# Patient Record
Sex: Male | Born: 1990 | Race: Black or African American | Hispanic: No | Smoking: Former smoker
Health system: Southern US, Community
[De-identification: ages and names within clinical notes are randomized; demographics above are authoritative.]

## PROBLEM LIST (undated history)

## (undated) DIAGNOSIS — I10 Essential (primary) hypertension: Secondary | ICD-10-CM

---
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
## 2010-05-20 ENCOUNTER — Emergency Department (HOSPITAL_COMMUNITY)
Admission: EM | Admit: 2010-05-20 | Discharge: 2010-05-21 | Payer: Self-pay | Source: Home / Self Care | Admitting: Emergency Medicine

## 2011-03-24 ENCOUNTER — Emergency Department (HOSPITAL_COMMUNITY): Admission: EM | Admit: 2011-03-24 | Payer: Self-pay | Source: Home / Self Care

## 2015-04-05 ENCOUNTER — Emergency Department (HOSPITAL_COMMUNITY)
Admission: EM | Admit: 2015-04-05 | Discharge: 2015-04-06 | Disposition: A | Discharge status: Home / Self Care | Payer: Worker's Compensation | Attending: Emergency Medicine | Admitting: Emergency Medicine

## 2015-04-05 ENCOUNTER — Encounter (HOSPITAL_COMMUNITY): Payer: Self-pay | Admitting: Emergency Medicine

## 2015-04-05 DIAGNOSIS — Z720 Tobacco use: Secondary | ICD-10-CM | POA: Insufficient documentation

## 2015-04-05 DIAGNOSIS — S79911A Unspecified injury of right hip, initial encounter: Secondary | ICD-10-CM | POA: Insufficient documentation

## 2015-04-05 DIAGNOSIS — Y92410 Unspecified street and highway as the place of occurrence of the external cause: Secondary | ICD-10-CM | POA: Diagnosis not present

## 2015-04-05 DIAGNOSIS — S79912A Unspecified injury of left hip, initial encounter: Secondary | ICD-10-CM | POA: Insufficient documentation

## 2015-04-05 DIAGNOSIS — S3992XA Unspecified injury of lower back, initial encounter: Secondary | ICD-10-CM | POA: Diagnosis present

## 2015-04-05 DIAGNOSIS — Y998 Other external cause status: Secondary | ICD-10-CM | POA: Diagnosis not present

## 2015-04-05 DIAGNOSIS — M25552 Pain in left hip: Secondary | ICD-10-CM

## 2015-04-05 DIAGNOSIS — Y9389 Activity, other specified: Secondary | ICD-10-CM | POA: Diagnosis not present

## 2015-04-05 DIAGNOSIS — S39012A Strain of muscle, fascia and tendon of lower back, initial encounter: Secondary | ICD-10-CM | POA: Diagnosis not present

## 2015-04-05 DIAGNOSIS — M25551 Pain in right hip: Secondary | ICD-10-CM
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
## 2015-04-06 ENCOUNTER — Emergency Department (HOSPITAL_COMMUNITY): Payer: Worker's Compensation
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
## 2015-04-06 MED ORDER — NAPROXEN 500 MG PO TABS: 500.0000 mg | ORAL_TABLET | Freq: Two times a day (BID) | ORAL | Status: DC

## 2015-04-06 MED ORDER — TRAMADOL HCL 50 MG PO TABS: 50.0000 mg | ORAL_TABLET | Freq: Four times a day (QID) | ORAL | Status: DC | PRN

## 2015-04-06 MED ORDER — IBUPROFEN 800 MG PO TABS
800.0000 mg | ORAL_TABLET | Freq: Once | ORAL | Status: AC
  Administered 2015-04-06: 800 mg via ORAL
  Filled 2015-04-06: qty 1
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
## 2017-10-09 ENCOUNTER — Encounter (HOSPITAL_COMMUNITY): Payer: Self-pay | Admitting: Emergency Medicine

## 2017-10-09 ENCOUNTER — Emergency Department (HOSPITAL_COMMUNITY)
Admission: EM | Admit: 2017-10-09 | Discharge: 2017-10-09 | Disposition: A | Discharge status: Home / Self Care | Payer: Self-pay | Attending: Emergency Medicine | Admitting: Emergency Medicine

## 2017-10-09 ENCOUNTER — Other Ambulatory Visit: Payer: Self-pay

## 2017-10-09 DIAGNOSIS — N4830 Priapism, unspecified: Secondary | ICD-10-CM | POA: Insufficient documentation

## 2017-10-09 DIAGNOSIS — F17210 Nicotine dependence, cigarettes, uncomplicated: Secondary | ICD-10-CM | POA: Insufficient documentation

## 2017-10-09 LAB — CBC
HEMATOCRIT: 39.6 % (ref 39.0–52.0)
HEMOGLOBIN: 14.3 g/dL (ref 13.0–17.0)
MCH: 32.2 pg (ref 26.0–34.0)
MCHC: 36.1 g/dL — AB (ref 30.0–36.0)
MCV: 89.2 fL (ref 78.0–100.0)
Platelets: 274 10*3/uL (ref 150–400)
RBC: 4.44 MIL/uL (ref 4.22–5.81)
RDW: 13.1 % (ref 11.5–15.5)
WBC: 10.5 10*3/uL (ref 4.0–10.5)

## 2017-10-09 LAB — BASIC METABOLIC PANEL
Anion gap: 8 (ref 5–15)
BUN: 8 mg/dL (ref 6–20)
CO2: 21 mmol/L — AB (ref 22–32)
Calcium: 8.3 mg/dL — ABNORMAL LOW (ref 8.9–10.3)
Chloride: 106 mmol/L (ref 101–111)
Creatinine, Ser: 0.76 mg/dL (ref 0.61–1.24)
GFR calc Af Amer: >60 mL/min (ref >60)
GFR calc non Af Amer: >60 mL/min (ref >60)
GLUCOSE: 98 mg/dL (ref 65–99)
POTASSIUM: 4 mmol/L (ref 3.5–5.1)
Sodium: 135 mmol/L (ref 135–145)

## 2017-10-09 MED ORDER — SODIUM CHLORIDE 0.9 % IV BOLUS
1000.0000 mL | Freq: Once | INTRAVENOUS | Status: AC
  Administered 2017-10-09: 1000 mL via INTRAVENOUS

## 2017-10-09 MED ORDER — HYDROMORPHONE HCL 2 MG/ML IJ SOLN: 0.5000 mg | Freq: Once | INTRAMUSCULAR | Status: DC

## 2017-10-09 MED ORDER — LORAZEPAM 2 MG/ML IJ SOLN
1.0000 mg | Freq: Once | INTRAMUSCULAR | Status: AC
  Administered 2017-10-09: 1 mg via INTRAVENOUS
  Filled 2017-10-09: qty 1

## 2017-10-09 MED ORDER — PHENYLEPHRINE 200 MCG/ML FOR PRIAPISM / HYPOTENSION
100.0000 ug | INTRAMUSCULAR | Status: DC | PRN
  Administered 2017-10-09: 100 ug via INTRACAVERNOUS
  Filled 2017-10-09: qty 50

## 2017-10-09 MED ORDER — HYDROMORPHONE HCL 1 MG/ML IJ SOLN: 0.5000 mg | Freq: Once | INTRAMUSCULAR | Status: DC

## 2017-10-09 MED ORDER — HYDROMORPHONE HCL 1 MG/ML IJ SOLN
1.0000 mg | Freq: Once | INTRAMUSCULAR | Status: AC
Start: 2017-10-09 — End: 2017-10-09
  Administered 2017-10-09: 1 mg via INTRAVENOUS
  Filled 2017-10-09: qty 1
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
## 2017-10-09 NOTE — ED Provider Notes (Signed)
[facility] Note   CSN: [id number] Arrival date & time: 10/09/17  [id number]     History   Chief Complaint Chief Complaint  Patient presents with  . Groin Swelling  . priapism    HPI Matthew Kaiser is a 27 y.o. male without significant past medical history, presenting to the ED complaining of priapism that began around 3:30 to 4 AM this morning.  Patient states his erection is painful and persistent since that time.  He states he attempted to masturbate, however without relief continues to have an erection.  He denies any new medications, daily medications, or illicit drug use.  States this is never happened before.  Denies any other associated symptoms.  No history of sickle cell anemia.  The history is provided by the patient.    History reviewed. No pertinent past medical history.  There are no active problems to display for this patient.   History reviewed. No pertinent surgical history.      Home Medications    Prior to Admission medications   Medication Sig Start Date End Date Taking? Authorizing Provider  naproxen (NAPROSYN) 500 MG tablet Take 1 tablet (500 mg total) by mouth 2 (two) times daily. 04/06/15   Dione BoozeGlick, David, MD  traMADol (ULTRAM) 50 MG tablet Take 1 tablet (50 mg total) by mouth every 6 (six) hours as needed. 04/06/15   Dione BoozeGlick, David, MD    Family History No family history on file.  Social History Social History   Tobacco Use  . Smoking status: Current Every Day Smoker    Packs/day: 0.50    Types: Cigarettes  . Smokeless tobacco: Current User  Substance Use Topics  . Alcohol use: Yes    Comment: socially  . Drug use: Yes    Types: Marijuana     Allergies   Patient has no known allergies.   Review of Systems Review of Systems  Genitourinary: Positive for penile pain.  All other systems reviewed and are negative.    Physical Exam Updated Vital Signs BP 122/71 (BP Location: Left Arm)    Pulse 60   Temp 98 F (36.7 C) (Oral)   Resp 18   Ht 6\' 3"  (1.905 m)   Wt 106.6 kg (235 lb)   SpO2 98%   BMI 29.37 kg/m   Physical Exam  Constitutional: He appears well-developed and well-nourished.  HENT:  Head: Normocephalic and atraumatic.  Eyes: Conjunctivae are normal.  Cardiovascular: Normal rate.  Pulmonary/Chest: Effort normal.  Abdominal: Soft.  Genitourinary: Testes normal. Circumcised.  Genitourinary Comments: Exam performed with chaperone present. Penis with firm erection. Glans appears pink and is well-perfused.   Neurological: He is alert.  Skin: Skin is warm.  Psychiatric: He has a normal mood and affect. His behavior is normal.  Nursing note and vitals reviewed.    ED Treatments / Results  Labs (all labs ordered are listed, but only abnormal results are displayed) Labs Reviewed  BASIC METABOLIC PANEL - Abnormal; Notable for the following components:      Result Value   CO2 21 (*)    Calcium 8.3 (*)    All other components within normal limits  CBC - Abnormal; Notable for the following components:   MCHC 36.1 (*)    All other components within normal limits    EKG None  Radiology No results found.  Procedures Irrigate corpus cavern, priapism Date/Time: 10/09/2017 1:34 PM Performed by: Verner Kopischke, [place] N, PA-C Authorized by: Zyara Riling, [place]  N, PA-C  Consent: Verbal consent obtained. Risks and benefits: risks, benefits and alternatives were discussed Consent given by: patient Patient understanding: patient states understanding of the procedure being performed Required items: required blood products, implants, devices, and special equipment available Patient identity confirmed: verbally with patient Time out: Immediately prior to procedure a "time out" was called to verify the correct patient, procedure, equipment, support staff and site/side marked as required. Preparation: Patient was prepped and draped in the usual sterile fashion. Local  anesthesia used: no  Anesthesia: Local anesthesia used: no  Sedation: Patient sedated: no  Patient tolerance: Patient tolerated the procedure well with no immediate complications Comments: Pt prepped and draped in a sterile fashion. With the assistance of Dr. Deretha Emory, procedure performed. Using a 21-gauge needle, 35 cc of blood was aspirated from the right corpus cavernosum.  200 mcg of phenylephrine was injected with significant improvement in erection following procedure.    (including critical care time)  Medications Ordered in ED Medications  phenylephrine 200 mcg / ml CONC. DILUTION INJ (ED / Urology USE ONLY) (100 mcg Intracavernosal Given by Other 10/09/17 1157)  HYDROmorphone (DILAUDID) injection 0.5 mg (0 mg Intravenous Hold 10/09/17 1036)  HYDROmorphone (DILAUDID) injection 1 mg (1 mg Intravenous Given 10/09/17 0953)  LORazepam (ATIVAN) injection 1 mg (1 mg Intravenous Given 10/09/17 0953)  sodium chloride 0.9 % bolus 1,000 mL (0 mLs Intravenous Stopped 10/09/17 1349)     Initial Impression / Assessment and Plan / ED Course  I have reviewed the triage vital signs and the nursing notes.  Pertinent labs & imaging results that were available during my care of the patient were reviewed by me and considered in my medical decision making (see chart for details).  Clinical Course as of Oct 09 1400  Sat Oct 09, 2017  1124 Pt re-evaluated with persistent priapism. Will proceed with aspiration and phenylephrine injection.  Does endorse marijuana use at this time.   [JR]  1230 35cc blood aspirated. phenylephrine injected with good response.   [JR]  1300 Pt re-evaluated. Pts pain is substantially relieved and corpus is bendable. Will consult urology for recommendations and follow up.   [JR]  1334 Pt discussed with Dr. Mena Goes with Urology. He states it is normal to have some swelling due to post-ischemic tissues changes and can take several days to resolve. Reports no further  interventions are required and pt is recommended to follow up with PCP.   [JR]    Clinical Course User Index [JR] Mazin Emma, [place] N, PA-C    Patient presenting to the ED with priapism that began around 3:30 to 4:00 AM this morning.   Endorses marijuana use, however denies any other illicit drugs or new medications.  This is patient's first occurrence.  On arrival, patient with persistent painful erection, though without evidence of acute ischemia.  Conservative therapy attempted initially, including pain medication and topical cold therapy.  Pain was somewhat improved, however did not resolve and priapism was persistent.  Proceeded with aspiration of corpus cavernosum and then injected 200 mcg of phenylephrine with significant improvement.  On reevaluation, corpus cavernosum was bendable and patient with significant improvement and relief of pain.  Patient monitored for 1 hour following procedure, with persistent relief.  Discussed patient with Dr. Mena Goes, with urology, who recommends patient is safe for discharge at this time with PCP follow-up.  No other recommendations at this time.  Discussed plan with patient answered all questions.  Safe for discharge.  Patient discussed with  and seen by Dr. Deretha Emory.  Discussed results, findings, treatment and follow up. Patient advised of return precautions. Patient verbalized understanding and agreed with plan.  Final Clinical Impressions(s) / ED Diagnoses   Final diagnoses:  Priapism    ED Discharge Orders    None       Windsor Zirkelbach, [place] N, PA-C 10/09/17 1402    Vanetta Mulders, MD 10/10/17 [phone]

## 2017-10-09 NOTE — ED Triage Notes (Signed)
Pt. Stated, ive had an erection for 4 hours and can not get it to go down.

## 2017-10-09 NOTE — Discharge Instructions (Signed)
Drink plenty of fluid. Avoid marijuana, as this can cause recurrence of symptoms. Avoid sexual stimulation or intercourse for the next few days to allow for proper healing.  Follow up with your primary care provider regarding your visit today. Return to the ER as needed.

## 2017-10-09 NOTE — ED Provider Notes (Signed)
Medical screening examination/treatment/procedure(s) were conducted as a shared visit with non-physician practitioner(s) and myself.  I personally evaluated the patient during the encounter.  None   Patient seen by me along with physician assistant.  Patient with an erection for over 4 hours.  No past history of similar problem.  Initially tried comfort measures with some pain medicine and antianxiety medicine some IV fluids and ice packs no change.  Went ahead and did a corpus drawl of blood throughout about 30 cc.  And then injected 200 micro grams of phenylephrine.  Significant relaxation of the penis.  Not completely flaccid discussed with urology.  And he said it was okay for discharge home.  Patient much more comfortable penis is bendable.  No follow-up by them necessary.   Vanetta MuldersZackowski, Jeselle Hiser, MD 10/09/17 1414

## 2018-03-10 ENCOUNTER — Encounter (HOSPITAL_COMMUNITY): Payer: Self-pay

## 2018-03-10 ENCOUNTER — Ambulatory Visit (HOSPITAL_COMMUNITY)
Admission: EM | Admit: 2018-03-10 | Discharge: 2018-03-10 | Disposition: A | Discharge status: Home / Self Care | Payer: Self-pay | Attending: Family Medicine | Admitting: Family Medicine

## 2018-03-10 DIAGNOSIS — R300 Dysuria: Secondary | ICD-10-CM | POA: Insufficient documentation

## 2018-03-10 DIAGNOSIS — F17210 Nicotine dependence, cigarettes, uncomplicated: Secondary | ICD-10-CM | POA: Insufficient documentation

## 2018-03-10 DIAGNOSIS — Z113 Encounter for screening for infections with a predominantly sexual mode of transmission: Secondary | ICD-10-CM | POA: Insufficient documentation

## 2018-03-10 NOTE — ED Provider Notes (Signed)
[facility]    CSN: [id number] Arrival date & time: 03/10/18  1613     History   Chief Complaint Chief Complaint  Patient presents with  . STD Testing    HPI Renee RamusJoshua A Tamburo is a 27 y.o. male.   Patient is a 27 year old male here for 1 week of dysuria.  Symptoms have been constant . He is requesting STD screening.  He has been having unprotected sex.  He he denies any associated abdominal pain, back pain, hematuria, fevers, chills, body aches.  ROS per HPI      History reviewed. No pertinent past medical history.  There are no active problems to display for this patient.   History reviewed. No pertinent surgical history.     Home Medications    Prior to Admission medications   Medication Sig Start Date End Date Taking? Authorizing Provider  naproxen (NAPROSYN) 500 MG tablet Take 1 tablet (500 mg total) by mouth 2 (two) times daily. 04/06/15   Dione BoozeGlick, David, MD  traMADol (ULTRAM) 50 MG tablet Take 1 tablet (50 mg total) by mouth every 6 (six) hours as needed. 04/06/15   Dione BoozeGlick, David, MD    Family History History reviewed. No pertinent family history.  Social History Social History   Tobacco Use  . Smoking status: Current Every Day Smoker    Packs/day: 0.50    Types: Cigarettes  . Smokeless tobacco: Current User  Substance Use Topics  . Alcohol use: Yes    Comment: socially  . Drug use: Yes    Types: Marijuana     Allergies   Patient has no known allergies.   Review of Systems Review of Systems   Physical Exam Triage Vital Signs ED Triage Vitals [03/10/18 1711]  Enc Vitals Group     BP (!) 145/81     Pulse Rate 65     Resp 20     Temp 98.3 F (36.8 C)     Temp Source Oral     SpO2 100 %     Weight      Height      Head Circumference      Peak Flow      Pain Score 0     Pain Loc      Pain Edu?      Excl. in GC?    No data found.  Updated Vital Signs BP (!) 145/81 (BP Location: Left Arm)   Pulse 65   Temp 98.3 F  (36.8 C) (Oral)   Resp 20   SpO2 100%   Visual Acuity Right Eye Distance:   Left Eye Distance:   Bilateral Distance:    Right Eye Near:   Left Eye Near:    Bilateral Near:     Physical Exam  Constitutional: He is oriented to person, place, and time. He appears well-developed and well-nourished.  Very pleasant. Non toxic or ill appearing.     HENT:  Head: Normocephalic and atraumatic.  Nose: Nose normal.  Eyes: Conjunctivae are normal.  Neck: Normal range of motion.  Pulmonary/Chest: Effort normal.  Abdominal: Soft.  Abdomen soft, non tender. No CVA tenderness. No rebound tenderness.     Musculoskeletal: Normal range of motion.  Neurological: He is alert and oriented to person, place, and time.  Skin: Skin is warm and dry.  Psychiatric: He has a normal mood and affect.  Nursing note and vitals reviewed.    UC Treatments / Results  Labs (all labs ordered are  listed, but only abnormal results are displayed) Labs Reviewed  URINE CYTOLOGY ANCILLARY ONLY    EKG None  Radiology No results found.  Procedures Procedures (including critical care time)  Medications Ordered in UC Medications - No data to display  Initial Impression / Assessment and Plan / UC Course  I have reviewed the triage vital signs and the nursing notes.  Pertinent labs & imaging results that were available during my care of the patient were reviewed by me and considered in my medical decision making (see chart for details).     Urine sent for STD screening.  Pt refusing treatment in clinic today wanting to wait on the results.  Labs pending and will call with any positive results.  Pt understanding.  Final Clinical Impressions(s) / UC Diagnoses   Final diagnoses:  Dysuria  Screening for STD (sexually transmitted disease)     Discharge Instructions     It was nice meeting you!!  We are screening you for STDs We will call with any positive results.    ED Prescriptions     None     Controlled Substance Prescriptions [place] Controlled Substance Registry consulted? Not Applicable   Janace Aris, NP 03/10/18 1734

## 2018-03-10 NOTE — Discharge Instructions (Addendum)
It was nice meeting you!!  We are screening you for STDs We will call with any positive results.

## 2018-03-10 NOTE — ED Triage Notes (Signed)
Pt presents to get STD Testing

## 2018-03-11 LAB — URINE CYTOLOGY ANCILLARY ONLY
CHLAMYDIA, DNA PROBE: NEGATIVE
NEISSERIA GONORRHEA: NEGATIVE
TRICH (WINDOWPATH): NEGATIVE

## 2018-03-15 LAB — URINE CYTOLOGY ANCILLARY ONLY: Candida vaginitis: NEGATIVE

## 2018-09-07 ENCOUNTER — Telehealth (INDEPENDENT_AMBULATORY_CARE_PROVIDER_SITE_OTHER): Payer: Self-pay

## 2018-09-07 NOTE — Telephone Encounter (Signed)
Called patient no answer. LMOM. We need to let him know per Mardella Layman she did not approve for him to RTW end of April.   Last form Mardella Layman filled out was for him to RTW  With no restrictions 08/31/2018.- form ready for pick up at the front desk.

## 2018-11-28 ENCOUNTER — Other Ambulatory Visit: Payer: Self-pay

## 2018-11-28 ENCOUNTER — Encounter (HOSPITAL_COMMUNITY): Payer: Self-pay | Admitting: Emergency Medicine

## 2018-11-28 ENCOUNTER — Emergency Department (HOSPITAL_COMMUNITY)
Admission: EM | Admit: 2018-11-28 | Discharge: 2018-11-28 | Disposition: A | Discharge status: Home / Self Care | Payer: Self-pay | Attending: Emergency Medicine | Admitting: Emergency Medicine

## 2018-11-28 ENCOUNTER — Emergency Department (HOSPITAL_COMMUNITY): Payer: Self-pay

## 2018-11-28 DIAGNOSIS — Y990 Civilian activity done for income or pay: Secondary | ICD-10-CM | POA: Insufficient documentation

## 2018-11-28 DIAGNOSIS — S61214A Laceration without foreign body of right ring finger without damage to nail, initial encounter: Secondary | ICD-10-CM | POA: Insufficient documentation

## 2018-11-28 DIAGNOSIS — Z23 Encounter for immunization: Secondary | ICD-10-CM | POA: Insufficient documentation

## 2018-11-28 DIAGNOSIS — Y9389 Activity, other specified: Secondary | ICD-10-CM | POA: Insufficient documentation

## 2018-11-28 DIAGNOSIS — S61214S Laceration without foreign body of right ring finger without damage to nail, sequela: Secondary | ICD-10-CM

## 2018-11-28 DIAGNOSIS — Y929 Unspecified place or not applicable: Secondary | ICD-10-CM | POA: Insufficient documentation

## 2018-11-28 DIAGNOSIS — F17210 Nicotine dependence, cigarettes, uncomplicated: Secondary | ICD-10-CM | POA: Insufficient documentation

## 2018-11-28 DIAGNOSIS — W25XXXA Contact with sharp glass, initial encounter: Secondary | ICD-10-CM | POA: Insufficient documentation

## 2018-11-28 IMAGING — DX RIGHT HAND - COMPLETE 3+ VIEW
3 series · 3 of 3 positions shown · non-contrast
Comparison: None.

CLINICAL DATA: 28-year-old male with cardia of the right ring
finger and increasing swelling.

EXAM:
RIGHT HAND - COMPLETE 3+ VIEW

[hand ap]
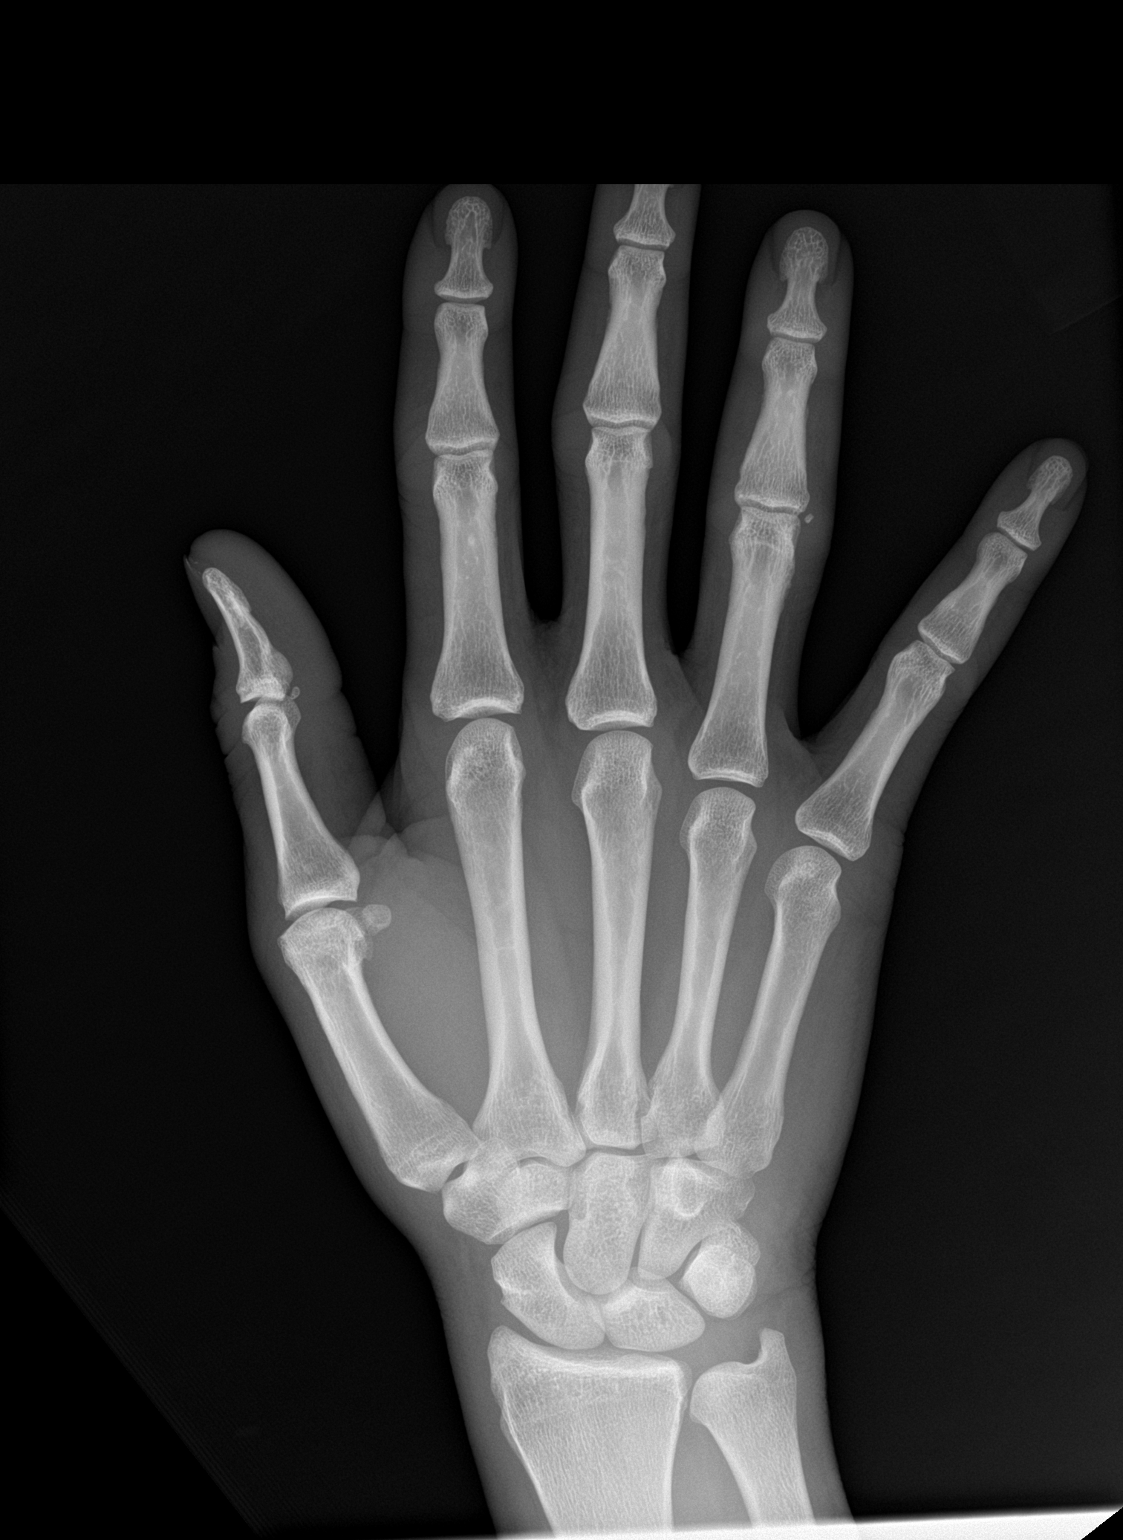

[hand obl]
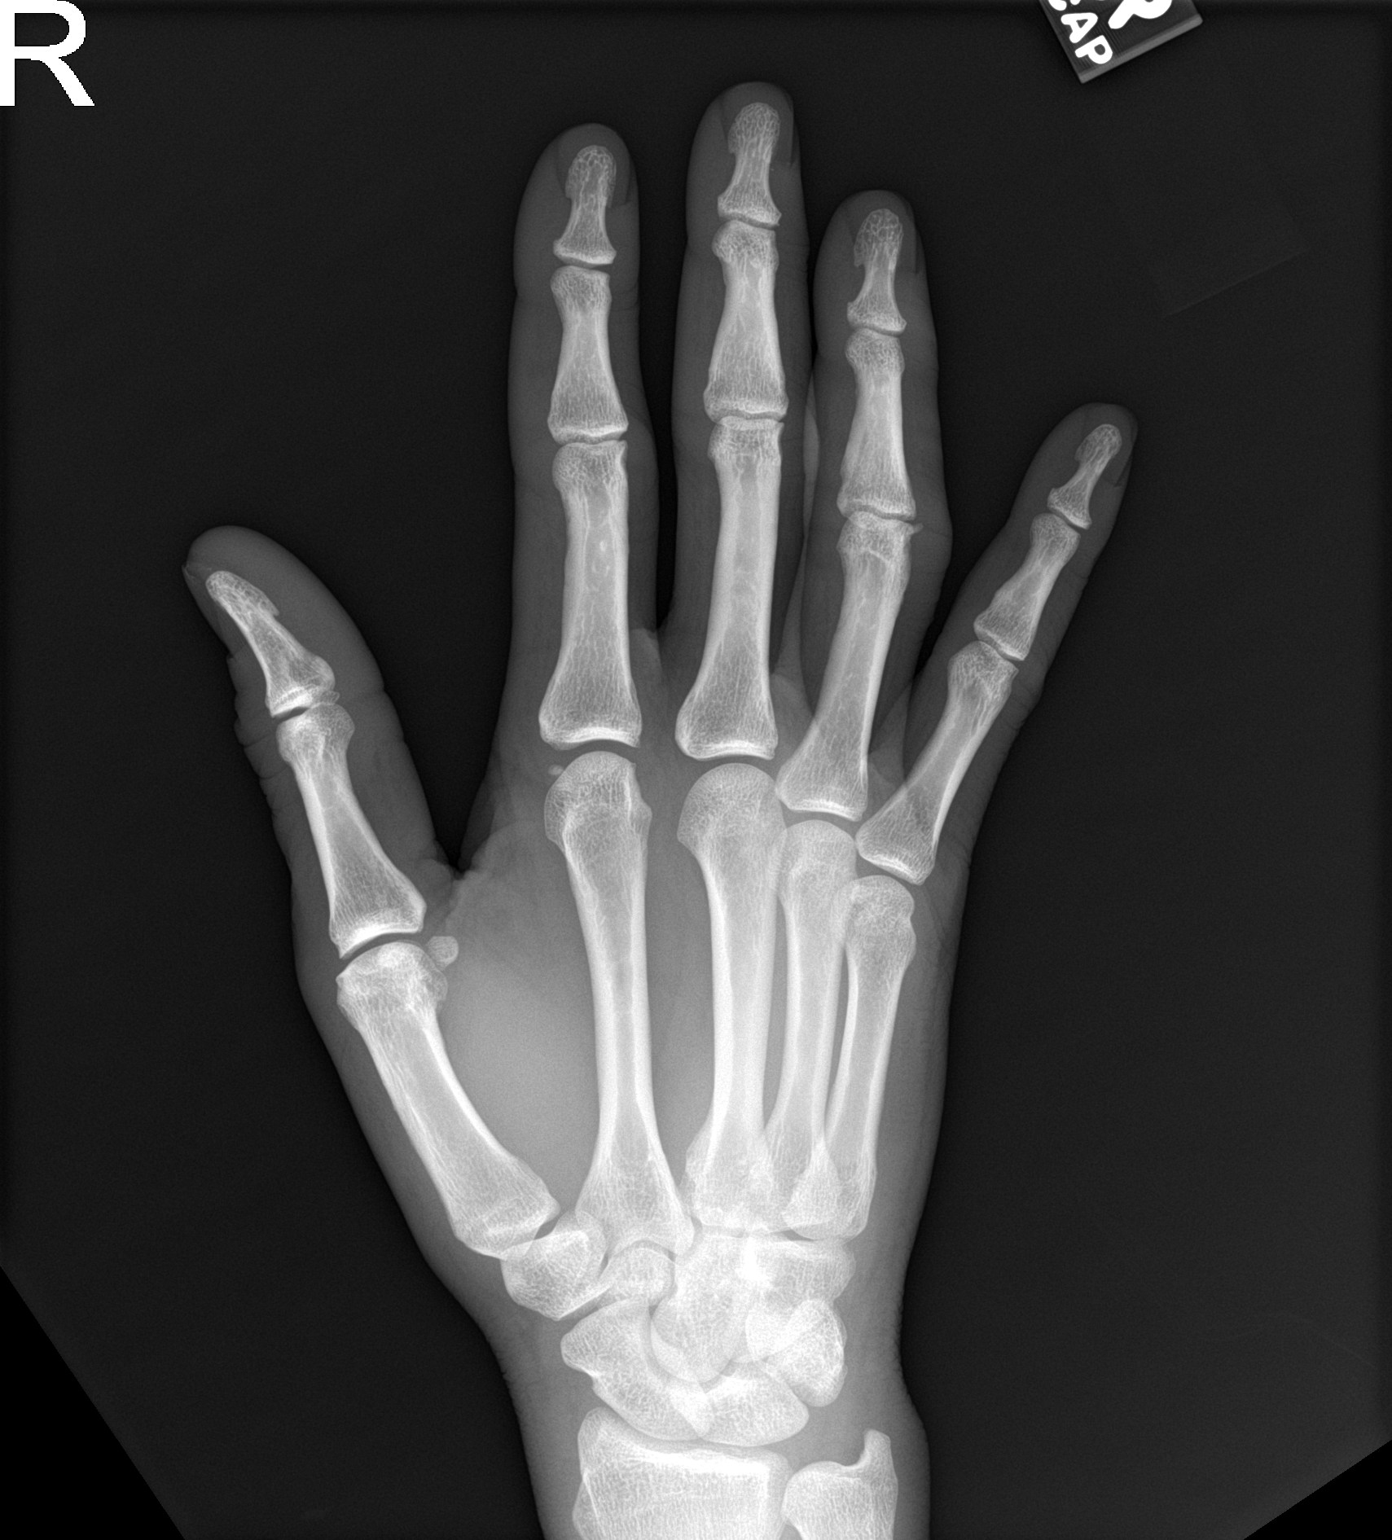

[hand lat]
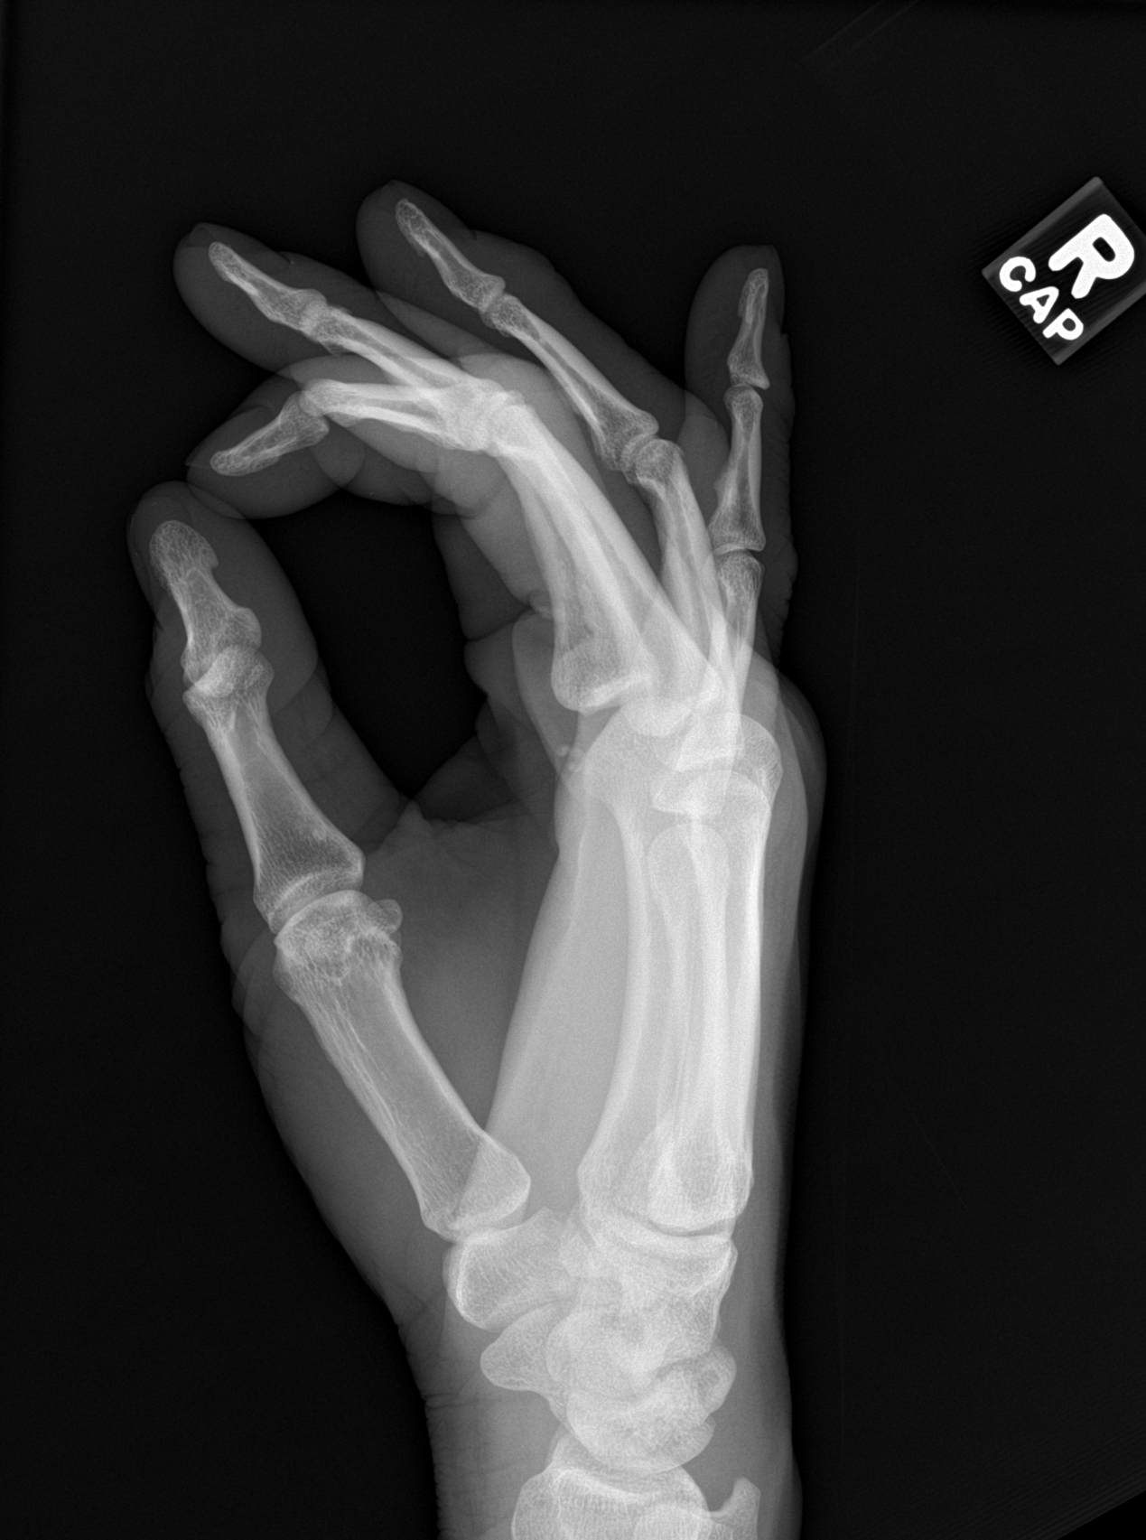

[3 of 3 positions shown; findings below may reference images not displayed]

FINDINGS: There is no acute fracture or dislocation. Small well corticated
calcific focus adjacent to the PIP of the fourth digit appears
chronic. The bones are well mineralized. No arthritic changes. The
soft tissues appear unremarkable. No radiopaque foreign object or
soft tissue gas.
IMPRESSION: Negative.

## 2018-11-28 MED ORDER — TETANUS-DIPHTH-ACELL PERTUSSIS 5-2.5-18.5 LF-MCG/0.5 IM SUSP
0.5000 mL | Freq: Once | INTRAMUSCULAR | Status: AC
  Administered 2018-11-28: 0.5 mL via INTRAMUSCULAR
  Filled 2018-11-28: qty 0.5

## 2018-11-28 MED ORDER — BACITRACIN ZINC 500 UNIT/GM EX OINT
TOPICAL_OINTMENT | Freq: Two times a day (BID) | CUTANEOUS | Status: DC
  Administered 2018-11-28: 1 via TOPICAL
  Filled 2018-11-28: qty 0.9

## 2018-11-28 MED ORDER — MUPIROCIN CALCIUM 2 % EX CREA: 1.0000 "application " | TOPICAL_CREAM | Freq: Two times a day (BID) | CUTANEOUS | 0 refills | Status: DC

## 2018-11-28 MED ORDER — CEPHALEXIN 500 MG PO CAPS: 500.0000 mg | ORAL_CAPSULE | Freq: Four times a day (QID) | ORAL | 0 refills | Status: DC

## 2018-11-28 MED ORDER — IBUPROFEN 400 MG PO TABS: 400.0000 mg | ORAL_TABLET | Freq: Four times a day (QID) | ORAL | 0 refills | Status: DC | PRN

## 2018-11-28 MED ORDER — CEPHALEXIN 500 MG PO CAPS
500.0000 mg | ORAL_CAPSULE | Freq: Once | ORAL | Status: AC
  Administered 2018-11-28: 500 mg via ORAL
  Filled 2018-11-28: qty 1

## 2018-11-28 MED ORDER — SULFAMETHOXAZOLE-TRIMETHOPRIM 800-160 MG PO TABS
1.0000 | ORAL_TABLET | Freq: Once | ORAL | Status: AC
  Administered 2018-11-28: 1 via ORAL
  Filled 2018-11-28: qty 1

## 2018-11-28 MED ORDER — SULFAMETHOXAZOLE-TRIMETHOPRIM 800-160 MG PO TABS: 1.0000 | ORAL_TABLET | Freq: Two times a day (BID) | ORAL | 0 refills | Status: AC

## 2018-11-28 MED ORDER — ACETAMINOPHEN 500 MG PO TABS
1000.0000 mg | ORAL_TABLET | Freq: Once | ORAL | Status: DC
  Filled 2018-11-28: qty 2

## 2018-11-28 NOTE — ED Provider Notes (Signed)
[facility]-EMERGENCY DEPT Provider Note   CSN: [id number] Arrival date & time: 11/28/18  0021    History   Chief Complaint Chief Complaint  Patient presents with  . Laceration  . Finger Injury    HPI Matthew Kaiser is a 28 y.o. male.     The history is provided by the patient.  Laceration  Location:  Finger Finger laceration location:  R ring finger Length:  .9  Depth:  Through dermis Quality: straight   Bleeding: uncontrolled   Time since incident:  9 days Laceration mechanism:  Broken glass (while working on car) Pain details:    Quality:  Aching   Severity:  Moderate   Timing:  Constant   Progression:  Unchanged Relieved by:  Nothing Worsened by:  Nothing Ineffective treatments:  None tried Tetanus status:  Out of date Associated symptoms: no fever, no focal weakness, no numbness, no redness and no streaking     History reviewed. No pertinent past medical history.  There are no active problems to display for this patient.   History reviewed. No pertinent surgical history.      Home Medications    Prior to Admission medications   Medication Sig Start Date End Date Taking? Authorizing Provider  naproxen (NAPROSYN) 500 MG tablet Take 1 tablet (500 mg total) by mouth 2 (two) times daily. 04/06/15   Dione BoozeGlick, David, MD  traMADol (ULTRAM) 50 MG tablet Take 1 tablet (50 mg total) by mouth every 6 (six) hours as needed. 04/06/15   Dione BoozeGlick, David, MD    Family History History reviewed. No pertinent family history.  Social History Social History   Tobacco Use  . Smoking status: Current Every Day Smoker    Packs/day: 0.50    Types: Cigarettes  . Smokeless tobacco: Current User  Substance Use Topics  . Alcohol use: Yes    Comment: socially  . Drug use: Yes    Types: Marijuana     Allergies   Patient has no known allergies.   Review of Systems Review of Systems  Constitutional: Negative for fever.  Respiratory: Negative for  cough and shortness of breath.   Cardiovascular: Negative for chest pain.  Musculoskeletal: Positive for arthralgias.  Skin: Positive for wound.  Neurological: Negative for focal weakness.  All other systems reviewed and are negative.    Physical Exam Updated Vital Signs BP (!) 150/93 (BP Location: Left Arm)   Pulse 79   Temp 98.3 F (36.8 C) (Oral)   Resp 16   Ht 6\' 3"  (1.905 m)   Wt 104.3 kg   SpO2 98%   BMI 28.75 kg/m   Physical Exam Vitals signs and nursing note reviewed.  Constitutional:      General: He is not in acute distress.    Appearance: He is normal weight.  HENT:     Head: Normocephalic and atraumatic.     Nose: Nose normal.  Eyes:     Conjunctiva/sclera: Conjunctivae normal.     Pupils: Pupils are equal, round, and reactive to light.  Neck:     Musculoskeletal: Normal range of motion and neck supple.  Cardiovascular:     Rate and Rhythm: Normal rate and regular rhythm.     Pulses: Normal pulses.     Heart sounds: Normal heart sounds.  Pulmonary:     Effort: Pulmonary effort is normal.     Breath sounds: Normal breath sounds.  Abdominal:     General: Abdomen is flat. Bowel sounds  are normal.     Tenderness: There is no abdominal tenderness.  Musculoskeletal: Normal range of motion.     Right wrist: Normal.     Right forearm: Normal.     Right hand: He exhibits laceration. He exhibits normal range of motion, no bony tenderness, normal two-point discrimination and normal capillary refill. Normal sensation noted. Normal strength noted.       Hands:     Comments: No Kanavel sign  Skin:    General: Skin is warm and dry.     Capillary Refill: Capillary refill takes less than 2 seconds.  Neurological:     General: No focal deficit present.     Mental Status: He is alert and oriented to person, place, and time.  Psychiatric:        Mood and Affect: Mood normal.        Behavior: Behavior normal.          ED Treatments / Results  Labs (all  labs ordered are listed, but only abnormal results are displayed) Labs Reviewed - No data to display  EKG None  Radiology No results found.  Procedures Procedures (including critical care time)  Medications Ordered in ED Medications  acetaminophen (TYLENOL) tablet 1,000 mg (1,000 mg Oral Refused 11/28/18 0215)  bacitracin ointment (1 application Topical Given 11/28/18 0215)  Tdap (BOOSTRIX) injection 0.5 mL (0.5 mLs Intramuscular Given 11/28/18 0114)  cephALEXin (KEFLEX) capsule 500 mg (500 mg Oral Given 11/28/18 0112)  sulfamethoxazole-trimethoprim (BACTRIM DS) 800-160 MG per tablet 1 tablet (1 tablet Oral Given 11/28/18 0112)     Initial Impression / Assessment and Plan / ED Course  Cleansed and soaked in betadine and sterile saline.  Tetanus updated will start PO and topical antibiotics RX sent to pharmacy and refer to hand surgery.  The wound is dirty and 27 days old and cannot be closed in the ED.  Buddy splint for comfort and wound care instructions.    Final Clinical Impressions(s) / ED Diagnoses  Return for intractable cough, coughing up blood,fevers >100.4 unrelieved by medication, shortness of breath, intractable vomiting, chest pain, shortness of breath, weakness,numbness, changes in speech, facial asymmetry,abdominal pain, passing out,Inability to tolerate liquids or food, cough, altered mental status or any concerns. No signs of systemic illness or infection. The patient is nontoxic-appearing on exam and vital signs are within normal limits.   I have reviewed the triage vital signs and the nursing notes. Pertinent labs &imaging results that were available during my care of the patient were reviewed by me and considered in my medical decision making (see chart for details).  After history, exam, and medical workup I feel the patient has been appropriately medically screened and is safe for discharge home. Pertinent diagnoses were discussed with the patient. Patient was  given return precautions    Demico Ploch, MD 11/28/18 9379

## 2018-11-28 NOTE — ED Triage Notes (Signed)
Pt reports cutting right ring finger last week and jammed finger. Pt now reporting increasing swelling and pain.

## 2019-03-06 ENCOUNTER — Other Ambulatory Visit: Payer: Self-pay

## 2019-03-06 ENCOUNTER — Encounter (HOSPITAL_COMMUNITY): Payer: Self-pay | Admitting: Emergency Medicine

## 2019-03-06 ENCOUNTER — Emergency Department (HOSPITAL_COMMUNITY)
Admission: EM | Admit: 2019-03-06 | Discharge: 2019-03-06 | Disposition: A | Discharge status: Home / Self Care | Payer: Self-pay | Attending: Emergency Medicine | Admitting: Emergency Medicine

## 2019-03-06 DIAGNOSIS — M545 Low back pain, unspecified: Secondary | ICD-10-CM

## 2019-03-06 DIAGNOSIS — H6122 Impacted cerumen, left ear: Secondary | ICD-10-CM | POA: Insufficient documentation

## 2019-03-06 DIAGNOSIS — F17210 Nicotine dependence, cigarettes, uncomplicated: Secondary | ICD-10-CM | POA: Insufficient documentation

## 2019-03-06 DIAGNOSIS — H60501 Unspecified acute noninfective otitis externa, right ear: Secondary | ICD-10-CM | POA: Insufficient documentation

## 2019-03-06 DIAGNOSIS — F17228 Nicotine dependence, chewing tobacco, with other nicotine-induced disorders: Secondary | ICD-10-CM | POA: Insufficient documentation

## 2019-03-06 DIAGNOSIS — R030 Elevated blood-pressure reading, without diagnosis of hypertension: Secondary | ICD-10-CM | POA: Insufficient documentation

## 2019-03-06 DIAGNOSIS — F1210 Cannabis abuse, uncomplicated: Secondary | ICD-10-CM | POA: Insufficient documentation

## 2019-03-06 MED ORDER — HYDROCODONE-ACETAMINOPHEN 5-325 MG PO TABS: 1.0000 | ORAL_TABLET | ORAL | 0 refills | Status: DC | PRN

## 2019-03-06 MED ORDER — CIPROFLOXACIN-DEXAMETHASONE 0.3-0.1 % OT SUSP
4.0000 [drp] | Freq: Once | OTIC | Status: AC
  Administered 2019-03-06: 4 [drp] via OTIC
  Filled 2019-03-06: qty 7.5

## 2019-03-06 NOTE — ED Provider Notes (Signed)
[facility] DEPT Provider Note   CSN: [id number] Arrival date & time: 03/06/19  1100     History   Chief Complaint Chief Complaint  Patient presents with  . Otalgia    HPI Matthew Kaiser is a 28 y.o. male.     The history is provided by the patient.  Otalgia Location:  Right Behind ear:  Swelling Quality:  Aching and throbbing Severity:  Severe Onset quality:  Gradual Duration:  2 weeks Timing:  Constant Progression:  Worsening Chronicity:  New Context: not direct blow, not elevation change, not foreign body in ear, not loud noise, not recent URI and not water in ear   Relieved by:  Nothing Worsened by:  Nothing Ineffective treatments:  None tried Associated symptoms: ear discharge   Associated symptoms: no abdominal pain, no congestion, no cough, no diarrhea, no fever, no headaches, no hearing loss, no neck pain, no rash, no rhinorrhea, no sore throat, no tinnitus and no vomiting   Risk factors: no recent travel, no chronic ear infection and no prior ear surgery   Back Pain Location:  Lumbar spine Quality:  Aching Radiates to:  Does not radiate Pain severity now: no current pain, severe at times. Pain is:  Unable to specify Onset quality:  Sudden Duration:  1 hour Timing:  Sporadic Progression:  Waxing and waning Chronicity:  Recurrent Context: lifting heavy objects   Context: not emotional stress, not falling, not jumping from heights, not MCA, not MVA, not occupational injury, not pedestrian accident, not physical stress, not recent illness, not recent injury and not twisting   Relieved by:  Being still, heating pad and NSAIDs Worsened by:  Bending Ineffective treatments:  Ibuprofen, lying down and being still Associated symptoms: no abdominal pain, no abdominal swelling, no bladder incontinence, no bowel incontinence, no chest pain, no dysuria, no fever, no headaches, no leg pain, no numbness, no paresthesias, no pelvic pain, no  perianal numbness, no tingling, no weakness and no weight loss   Risk factors: no hx of cancer, no hx of osteoporosis, no lack of exercise, no menopause, not obese, not pregnant, no recent surgery, no steroid use and no vascular disease     History reviewed. No pertinent past medical history.  There are no active problems to display for this patient.   History reviewed. No pertinent surgical history.      Home Medications    Prior to Admission medications   Medication Sig Start Date End Date Taking? Authorizing Provider  cephALEXin (KEFLEX) 500 MG capsule Take 1 capsule (500 mg total) by mouth 4 (four) times daily. 11/28/18   Palumbo, April, MD  ibuprofen (ADVIL) 400 MG tablet Take 1 tablet (400 mg total) by mouth every 6 (six) hours as needed. 11/28/18   Palumbo, April, MD  mupirocin cream (BACTROBAN) 2 % Apply 1 application topically 2 (two) times daily. 11/28/18   Palumbo, April, MD  naproxen (NAPROSYN) 500 MG tablet Take 1 tablet (500 mg total) by mouth 2 (two) times daily. 78/4/69   Delora Fuel, MD  traMADol (ULTRAM) 50 MG tablet Take 1 tablet (50 mg total) by mouth every 6 (six) hours as needed. 62/9/52   Delora Fuel, MD    Family History No family history on file.  Social History Social History   Tobacco Use  . Smoking status: Current Every Day Smoker    Packs/day: 0.50    Types: Cigarettes  . Smokeless tobacco: Current User  Substance Use Topics  .  Alcohol use: Yes    Comment: socially  . Drug use: Yes    Types: Marijuana     Allergies   Patient has no known allergies.   Review of Systems Review of Systems  Constitutional: Negative for fever and weight loss.  HENT: Positive for ear discharge and ear pain. Negative for congestion, hearing loss, rhinorrhea, sore throat and tinnitus.   Respiratory: Negative for cough.   Cardiovascular: Negative for chest pain.  Gastrointestinal: Negative for abdominal pain, bowel incontinence, diarrhea and vomiting.   Genitourinary: Negative for bladder incontinence, dysuria and pelvic pain.  Musculoskeletal: Positive for back pain. Negative for neck pain.  Skin: Negative for rash.  Neurological: Negative for tingling, weakness, numbness, headaches and paresthesias.  All other systems reviewed and are negative.    Physical Exam Updated Vital Signs BP (!) 152/111 (BP Location: Right Arm) Comment: error  Pulse 65   Temp 98.9 F (37.2 C) (Oral)   Resp 16   Ht 6\' 3"  (1.905 m)   Wt 106.6 kg   SpO2 100%   BMI 29.37 kg/m   Physical Exam Vitals signs and nursing note reviewed.  Constitutional:      General: He is not in acute distress.    Appearance: He is well-developed. He is not diaphoretic.  HENT:     Head: Normocephalic and atraumatic.     Right Ear: Decreased hearing noted. Drainage, swelling and tenderness present. No mastoid tenderness. Tympanic membrane is not injected.     Left Ear: There is impacted cerumen.     Ears:     Comments: Left ear with cerumen impaction R ear painful with movement of pinna, localized lymphadenopathy, Serous drainage and  Canal swelling. Debris noted within the canal. Eyes:     General: No scleral icterus.    Conjunctiva/sclera: Conjunctivae normal.  Neck:     Musculoskeletal: Normal range of motion and neck supple.  Cardiovascular:     Rate and Rhythm: Normal rate and regular rhythm.     Heart sounds: Normal heart sounds.  Pulmonary:     Effort: Pulmonary effort is normal. No respiratory distress.     Breath sounds: Normal breath sounds.  Abdominal:     Palpations: Abdomen is soft.     Tenderness: There is no abdominal tenderness.  Musculoskeletal:     Lumbar back: He exhibits normal range of motion and no bony tenderness.  Skin:    General: Skin is warm and dry.  Neurological:     Mental Status: He is alert.  Psychiatric:        Behavior: Behavior normal.      ED Treatments / Results  Labs (all labs ordered are listed, but only abnormal  results are displayed) Labs Reviewed - No data to display  EKG None  Radiology No results found.  Procedures Procedures (including critical care time)  Medications Ordered in ED Medications  ciprofloxacin-dexamethasone (CIPRODEX) 0.3-0.1 % OTIC (EAR) suspension 4 drop (has no administration in time range)     Initial Impression / Assessment and Plan / ED Course  I have reviewed the triage vital signs and the nursing notes.  Pertinent labs & imaging results that were available during my care of the patient were reviewed by me and considered in my medical decision making (see chart for details).        28 year old old male here primarily with pain in his right ear.  Patient states that has been worsening over the past 2 weeks.  He is  noted drainage from the right ear and now to have severe throbbing pain.  He also has some decreased hearing on that side.  Physical exam consistent with acute otitis externa. I placed ear wick in the ear.  He was given 4 drops of Ciprodex and instructed in home usage.  Patient also has a cerumen impaction on the left.  I have advised him to use Debrox at home.  Patient will receive pain medication he may use anti-inflammatories as needed.  Patient noted to be hypertensive here today.  He is advised to have his blood pressure repeated when he has not any stressful/painful situation and should he continue to have elevated pressures follow-up with a primary care doctor. Patient has had about 3 years of intermittent episodes of severe back pain.  Patient states that this happens frequently when he leans over to pick up his daughter.  He states that sometimes his back locks up and he has to lay on the floor for 15 to 20 minutes before he can move.  This seems to have increased in frequency.  Patient denies any red flag symptoms such as leg weakness, numbness, paresthesia of the perianal region.  He does not use IV drugs and has no recent procedures to his back.  I  have advised the patient that this appears to be a musculoskeletal issue.  He may work on core strengthening issues, stretching and follow-up with a chiropractor or physical therapist.  Final Clinical Impressions(s) / ED Diagnoses   Final diagnoses:  None    ED Discharge Orders    None       Arthor Captain, PA-C 03/06/19 1155    Cathren Laine, MD 03/06/19 [phone]

## 2019-03-06 NOTE — ED Triage Notes (Signed)
Patient c/o right ear pain x2 weeks. Denies cough, congestion, fever. Also c/o intermittent back pain x3 years. Ambulatory.

## 2019-03-06 NOTE — Discharge Instructions (Addendum)
° °  1) For your right ear infection use naproxen over-the-counter (Aleve) for long-term pain management as directed by the bottle.  I have ordered stronger medication for pain control if you are having difficulty sleeping.  Use the Ciprodex eardrops 4 drops in the right ear twice daily for 7 days 2) For your left ear earwax impaction purchase over-the-counter Debrox to help remove the earwax and prevent external canal infection on the left.  Contact a health care provider if: You have a fever. Your ear is still red, swollen, painful, or draining pus after 3 days. Your redness, swelling, or pain gets worse. You have a severe headache. You have redness, swelling, pain, or tenderness in the area behind your ear.

## 2019-05-20 ENCOUNTER — Encounter (HOSPITAL_COMMUNITY): Payer: Self-pay | Admitting: Obstetrics and Gynecology

## 2019-05-20 ENCOUNTER — Emergency Department (HOSPITAL_COMMUNITY)
Admission: EM | Admit: 2019-05-20 | Discharge: 2019-05-20 | Disposition: A | Discharge status: Home / Self Care | Payer: Self-pay | Attending: Emergency Medicine | Admitting: Emergency Medicine

## 2019-05-20 ENCOUNTER — Other Ambulatory Visit: Payer: Self-pay

## 2019-05-20 DIAGNOSIS — M79672 Pain in left foot: Secondary | ICD-10-CM | POA: Insufficient documentation

## 2019-05-20 DIAGNOSIS — M79671 Pain in right foot: Secondary | ICD-10-CM | POA: Insufficient documentation

## 2019-05-20 DIAGNOSIS — L84 Corns and callosities: Secondary | ICD-10-CM | POA: Insufficient documentation

## 2019-05-20 DIAGNOSIS — Z79899 Other long term (current) drug therapy: Secondary | ICD-10-CM | POA: Insufficient documentation

## 2019-05-20 DIAGNOSIS — F17210 Nicotine dependence, cigarettes, uncomplicated: Secondary | ICD-10-CM | POA: Insufficient documentation

## 2019-05-20 DIAGNOSIS — F1210 Cannabis abuse, uncomplicated: Secondary | ICD-10-CM | POA: Insufficient documentation

## 2019-05-20 MED ORDER — NAPROXEN 500 MG PO TABS: 500.0000 mg | ORAL_TABLET | Freq: Two times a day (BID) | ORAL | 0 refills | Status: DC

## 2019-05-20 NOTE — Discharge Instructions (Signed)
Take naproxen as directed.  As we discussed, you will need to follow-up with referred podiatry office for further treatment.  Return the emergency department for any fevers, redness or swelling of the feet, drainage from the feet or any other worsening concerning symptoms.

## 2019-05-20 NOTE — ED Triage Notes (Signed)
Pt has blistering and calloused areas to both feet and reports pain and difficulty staning and walking on his feet. Patient denies fall or injury. Pt denies ever seeing a podiatrist

## 2019-05-20 NOTE — ED Provider Notes (Signed)
[facility]-EMERGENCY DEPT Provider Note   CSN: [id number] Arrival date & time: 05/20/19  1430     History   Chief Complaint Chief Complaint  Patient presents with  . Foot Pain    Bilateral    HPI Matthew Kaiser is a 28 y.o. male who presents for evaluation of bilateral foot pain that has been ongoing for several weeks.  Patient reports that he has calluses on the dorsal aspects of his feet that cause him pain.  He states he occasionally will take ibuprofen.  He states that the areas are worse in the morning when he first wakes up and worse after being on his feet.  He has not seen any podiatrist or other doctor for symptoms.  He denies any fever, redness or swelling, numbness/weakness.     The history is provided by the patient.    History reviewed. No pertinent past medical history.  There are no active problems to display for this patient.   History reviewed. No pertinent surgical history.      Home Medications    Prior to Admission medications   Medication Sig Start Date End Date Taking? Authorizing Provider  cephALEXin (KEFLEX) 500 MG capsule Take 1 capsule (500 mg total) by mouth 4 (four) times daily. 11/28/18   Palumbo, April, MD  HYDROcodone-acetaminophen (NORCO) 5-325 MG tablet Take 1 tablet by mouth every 4 (four) hours as needed. 03/06/19   Harris, Cammy Copa, PA-C  ibuprofen (ADVIL) 400 MG tablet Take 1 tablet (400 mg total) by mouth every 6 (six) hours as needed. 11/28/18   Palumbo, April, MD  mupirocin cream (BACTROBAN) 2 % Apply 1 application topically 2 (two) times daily. 11/28/18   Palumbo, April, MD  naproxen (NAPROSYN) 500 MG tablet Take 1 tablet (500 mg total) by mouth 2 (two) times daily. 05/20/19   Maxwell Caul, PA-C  traMADol (ULTRAM) 50 MG tablet Take 1 tablet (50 mg total) by mouth every 6 (six) hours as needed. 04/06/15   Dione Booze, MD    Family History No family history on file.  Social History Social History   Tobacco  Use  . Smoking status: Current Every Day Smoker    Packs/day: 0.50    Types: Cigarettes  . Smokeless tobacco: Current User  Substance Use Topics  . Alcohol use: Yes    Comment: socially  . Drug use: Yes    Types: Marijuana     Allergies   Patient has no known allergies.   Review of Systems Review of Systems  Constitutional: Negative for fever.  Skin: Negative for color change.       Skin callus  Neurological: Negative for weakness and numbness.  All other systems reviewed and are negative.    Physical Exam Updated Vital Signs BP 117/83 (BP Location: Right Arm)   Pulse 77   Temp 98.6 F (37 C) (Oral)   Resp 16   Ht 6\' 4"  (1.93 m)   Wt 108.9 kg   SpO2 97%   BMI 29.21 kg/m   Physical Exam Vitals signs and nursing note reviewed.  Constitutional:      Appearance: He is well-developed.  HENT:     Head: Normocephalic and atraumatic.  Eyes:     General: No scleral icterus.       Right eye: No discharge.        Left eye: No discharge.     Conjunctiva/sclera: Conjunctivae normal.  Cardiovascular:     Pulses:  Dorsalis pedis pulses are 2+ on the right side and 2+ on the left side.  Pulmonary:     Effort: Pulmonary effort is normal.  Skin:    General: Skin is warm and dry.     Capillary Refill: Capillary refill takes less than 2 seconds.     Comments: Large quarter sized callus in the dorsal aspect of bilateral feet.  No surrounding warmth, erythema.  No blisters.  No rash that would be concerning for syphilis. Good distal cap refill.  BLE is not dusky in appearance or cool to touch.  Neurological:     Mental Status: He is alert.  Psychiatric:        Speech: Speech normal.        Behavior: Behavior normal.      ED Treatments / Results  Labs (all labs ordered are listed, but only abnormal results are displayed) Labs Reviewed - No data to display  EKG None  Radiology No results found.  Procedures Procedures (including critical care time)   Medications Ordered in ED Medications - No data to display   Initial Impression / Assessment and Plan / ED Course  I have reviewed the triage vital signs and the nursing notes.  Pertinent labs & imaging results that were available during my care of the patient were reviewed by me and considered in my medical decision making (see chart for details).        28 year old male who presents for evaluation of callus to both feet.  States has been ongoing for a while.  Has not followed up with anybody.  No fevers, warmth, erythema. Patient is afebrile , non-toxic appearing, sitting comfortably on examination table. Vital signs reviewed and stable. Patient is neurovascularly intact.  On exam, he has 2 large quarter sized areas of callus on the dorsal aspect of his feet.  No surrounding warmth, erythema.  No evidence of infectious etiology.  Additionally, he has no rash that would be concerning for syphilis.  I suspect his pain is coming from the callus.  This time, I do not see any infectious etiology that would require antibiotics.  Patient with recent blood work in September which showed normal kidney function.  Will give short course of naproxen help with pain.  Patient also given podiatry referral for further evaluation and treatment. At this time, patient exhibits no emergent life-threatening condition that require further evaluation in ED or admission. Patient had ample opportunity for questions and discussion. All patient's questions were answered with full understanding. Strict return precautions discussed. Patient expresses understanding and agreement to plan.   Portions of this note were generated with [employer]. Dictation errors may occur despite best attempts at proofreading.   Final Clinical Impressions(s) / ED Diagnoses   Final diagnoses:  Foot pain, bilateral  Skin callus    ED Discharge Orders         Ordered    naproxen (NAPROSYN) 500 MG tablet  2 times daily      05/20/19 1537           Desma Mcgregor 05/20/19 1542    Sherwood Gambler, MD 05/22/19 1002

## 2019-06-05 ENCOUNTER — Ambulatory Visit: Payer: Self-pay | Admitting: Podiatry

## 2019-06-05 ENCOUNTER — Encounter: Payer: Self-pay | Admitting: Podiatry

## 2019-06-05 ENCOUNTER — Other Ambulatory Visit: Payer: Self-pay

## 2019-06-05 VITALS — BP 110/73

## 2019-06-05 DIAGNOSIS — L989 Disorder of the skin and subcutaneous tissue, unspecified: Secondary | ICD-10-CM

## 2019-06-06 ENCOUNTER — Telehealth: Payer: Self-pay | Admitting: Podiatry

## 2019-06-06 NOTE — Telephone Encounter (Addendum)
Pt calling to follow up on previous message, states he is in pain and would like to know if the doctor can prescribe him a medication.   Pharmacy is [facility] on [address]

## 2019-06-06 NOTE — Telephone Encounter (Signed)
Pt called wanted to use this number [phone] instead of the number in last note

## 2019-06-06 NOTE — Telephone Encounter (Signed)
Dr Amalia Hailey pt seen 06/05/2019 wanting to know if he can get something for pain

## 2019-06-07 ENCOUNTER — Other Ambulatory Visit: Payer: Self-pay | Admitting: Podiatry

## 2019-06-07 ENCOUNTER — Telehealth: Payer: Self-pay | Admitting: Podiatry

## 2019-06-07 MED ORDER — HYDROCODONE-ACETAMINOPHEN 5-325 MG PO TABS: 1.0000 | ORAL_TABLET | Freq: Four times a day (QID) | ORAL | 0 refills | Status: DC | PRN

## 2019-06-07 NOTE — Progress Notes (Signed)
PRN postprocedure pain

## 2019-06-07 NOTE — Progress Notes (Signed)
   Subjective: 28 y.o. male presenting to the office today as a new patient with a chief complaint of painful lesions noted to the bilateral feet that have been present for the past 2-3 years. He describes the pain as stabbing and is concerned for nerve pain. Walking and standing increases the pain. He has had the lesions debrided in the past which helped alleviate the symptoms. Patient is here for further evaluation and treatment.    No past medical history on file.   Objective:  Physical Exam General: Alert and oriented x3 in no acute distress  Dermatology: Hyperkeratotic lesion(s) present on the bilateral feet. Pain on palpation with a central nucleated core noted. Skin is warm, dry and supple bilateral lower extremities. Negative for open lesions or macerations.  Vascular: Palpable pedal pulses bilaterally. No edema or erythema noted. Capillary refill within normal limits.  Neurological: Epicritic and protective threshold grossly intact bilaterally.   Musculoskeletal Exam: Pain on palpation at the keratotic lesion(s) noted. Range of motion within normal limits bilateral. Muscle strength 5/5 in all groups bilateral.  Assessment: 1. Porokeratosis bilateral feet x 3    Plan of Care:  1. Patient evaluated 2. Excisional debridement of keratoic lesion(s) using a chisel blade was performed without incident. Cantharone applied.  3. Dressed area with light dressing. 4. Patient is to return to the clinic in 2 weeks.   No charge next visit.   Edrick Kins, DPM [facility]  Dr. Edrick Kins, [address]                                        [place], [place] [address]                Office [phone]  Fax [phone]

## 2019-06-07 NOTE — Telephone Encounter (Signed)
Left message informing pt the medication had been called to his pharmacy.

## 2019-06-07 NOTE — Telephone Encounter (Signed)
Patient is calling because he said his pain medication was never called in.

## 2019-06-07 NOTE — Telephone Encounter (Signed)
Rx sent. - Dr. Evans

## 2019-06-19 ENCOUNTER — Other Ambulatory Visit: Payer: Self-pay

## 2019-06-19 ENCOUNTER — Ambulatory Visit (INDEPENDENT_AMBULATORY_CARE_PROVIDER_SITE_OTHER): Payer: Self-pay | Admitting: Podiatry

## 2019-06-19 DIAGNOSIS — L989 Disorder of the skin and subcutaneous tissue, unspecified: Secondary | ICD-10-CM

## 2019-06-24 NOTE — Progress Notes (Signed)
   Subjective: 28 y.o. male presenting to the office today for follow up evaluation of porokeratosis of the bilateral feet. He states the areas are doing much better and have improved since treatment at his last visit. He denies any complaints or concerns at this time. Patient is here for further evaluation and treatment.    No past medical history on file.   Objective:  Physical Exam General: Alert and oriented x3 in no acute distress  Dermatology: Hyperkeratotic lesion(s) present on the bilateral feet. Pain on palpation with a central nucleated core noted. Skin is warm, dry and supple bilateral lower extremities. Negative for open lesions or macerations.  Vascular: Palpable pedal pulses bilaterally. No edema or erythema noted. Capillary refill within normal limits.  Neurological: Epicritic and protective threshold grossly intact bilaterally.   Musculoskeletal Exam: Pain on palpation at the keratotic lesion(s) noted. Range of motion within normal limits bilateral. Muscle strength 5/5 in all groups bilateral.  Assessment: 1. Porokeratosis bilateral feet x 3    Plan of Care:  1. Patient evaluated 2. Excisional debridement of keratoic lesion(s) using a chisel blade was performed without incident.   3. Dressed area with light dressing. 4. Recommended OTC corn and callus remover.  5. Recommended good shoe gear.  6. Patient is to return to the clinic as needed.    Edrick Kins, DPM [facility]  Dr. Edrick Kins, [address]                                        [place], [place] [address]                Office [phone]  Fax [phone]

## 2019-10-16 ENCOUNTER — Ambulatory Visit: Payer: Self-pay | Admitting: Podiatry

## 2020-02-07 DIAGNOSIS — M79676 Pain in unspecified toe(s): Secondary | ICD-10-CM

## 2020-07-16 DIAGNOSIS — M79676 Pain in unspecified toe(s): Secondary | ICD-10-CM

## 2021-01-15 ENCOUNTER — Encounter (HOSPITAL_COMMUNITY): Payer: Self-pay | Admitting: Emergency Medicine

## 2021-01-15 ENCOUNTER — Emergency Department (HOSPITAL_COMMUNITY): Payer: Self-pay

## 2021-01-15 ENCOUNTER — Emergency Department (HOSPITAL_COMMUNITY)
Admission: EM | Admit: 2021-01-15 | Discharge: 2021-01-15 | Disposition: A | Discharge status: Home / Self Care | Payer: Self-pay | Attending: Emergency Medicine | Admitting: Emergency Medicine

## 2021-01-15 DIAGNOSIS — F17210 Nicotine dependence, cigarettes, uncomplicated: Secondary | ICD-10-CM | POA: Insufficient documentation

## 2021-01-15 DIAGNOSIS — R519 Headache, unspecified: Secondary | ICD-10-CM

## 2021-01-15 DIAGNOSIS — I159 Secondary hypertension, unspecified: Secondary | ICD-10-CM | POA: Insufficient documentation

## 2021-01-15 DIAGNOSIS — Z79899 Other long term (current) drug therapy: Secondary | ICD-10-CM | POA: Insufficient documentation

## 2021-01-15 LAB — CBC WITH DIFFERENTIAL/PLATELET
Abs Immature Granulocytes: 0.03 10*3/uL (ref 0.00–0.07)
Basophils Absolute: 0.1 10*3/uL (ref 0.0–0.1)
Basophils Relative: 1 %
Eosinophils Absolute: 0.4 10*3/uL (ref 0.0–0.5)
Eosinophils Relative: 3 %
HCT: 40.6 % (ref 39.0–52.0)
Hemoglobin: 14.8 g/dL (ref 13.0–17.0)
Immature Granulocytes: 0 %
Lymphocytes Relative: 25 %
Lymphs Abs: 3 10*3/uL (ref 0.7–4.0)
MCH: 31.9 pg (ref 26.0–34.0)
MCHC: 36.5 g/dL — ABNORMAL HIGH (ref 30.0–36.0)
MCV: 87.5 fL (ref 80.0–100.0)
Monocytes Absolute: 1.2 10*3/uL — ABNORMAL HIGH (ref 0.1–1.0)
Monocytes Relative: 10 %
Neutro Abs: 7.6 10*3/uL (ref 1.7–7.7)
Neutrophils Relative %: 61 %
Platelets: 304 10*3/uL (ref 150–400)
RBC: 4.64 MIL/uL (ref 4.22–5.81)
RDW: 13.4 % (ref 11.5–15.5)
WBC: 12.3 10*3/uL — ABNORMAL HIGH (ref 4.0–10.5)
nRBC: 0 % (ref 0.0–0.2)

## 2021-01-15 LAB — COMPREHENSIVE METABOLIC PANEL
ALT: 28 U/L (ref 0–44)
AST: 18 U/L (ref 15–41)
Albumin: 4.1 g/dL (ref 3.5–5.0)
Alkaline Phosphatase: 68 U/L (ref 38–126)
Anion gap: 8 (ref 5–15)
BUN: 19 mg/dL (ref 6–20)
CO2: 24 mmol/L (ref 22–32)
Calcium: 8.9 mg/dL (ref 8.9–10.3)
Chloride: 108 mmol/L (ref 98–111)
Creatinine, Ser: 0.79 mg/dL (ref 0.61–1.24)
GFR, Estimated: >60 mL/min (ref >60)
Glucose, Bld: 109 mg/dL — ABNORMAL HIGH (ref 70–99)
Potassium: 3.7 mmol/L (ref 3.5–5.1)
Sodium: 140 mmol/L (ref 135–145)
Total Bilirubin: 0.6 mg/dL (ref 0.3–1.2)
Total Protein: 7.1 g/dL (ref 6.5–8.1)

## 2021-01-15 IMAGING — MR MR MRA HEAD W/O CM
13 of 15 series · 39 of 48 positions shown · IV contrast (gadavist)
Comparison: Head CT same day

CLINICAL DATA: Headache over the last 10 days.  Abnormal head CT.

EXAM:
MRI HEAD WITHOUT AND WITH CONTRAST
MRA HEAD WITHOUT CONTRAST
TECHNIQUE: Multiplanar, multi-echo pulse sequences of the brain and surrounding
structures were acquired without and with intravenous contrast.
Angiographic images of the Circle of Willis were acquired using MRA
technique without intravenous contrast.
CONTRAST:  10mL GADAVIST GADOBUTROL 1 MMOL/ML IV SOLN

[Series 5: DWI · axial · 3.0mm · 1.36mm/px · z∈[-44,+109]mm · 6 of 104 slices shown (1 of 2)]
[im 1/104]
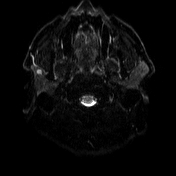
[im 21/104]
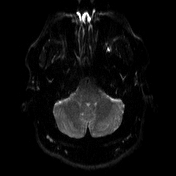
[im 42/104]
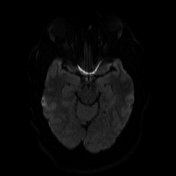
[im 62/104]
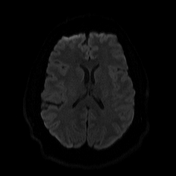
[im 83/104]
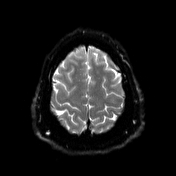
[im 104/104]
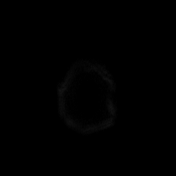

[Series 6: DWI · axial · 3.0mm · 1.36mm/px · z∈[-44,+109]mm · 3 of 52 slices shown (2 of 2)]
[im 1/52]
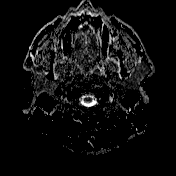
[im 26/52]
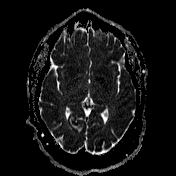
[im 52/52]
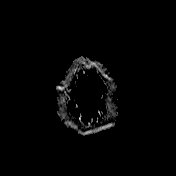

[Series 7: swi_images · axial · 3.0mm · 0.75mm/px · z∈[-41,+111]mm · 3 of 52 slices shown]
[im 1/52]
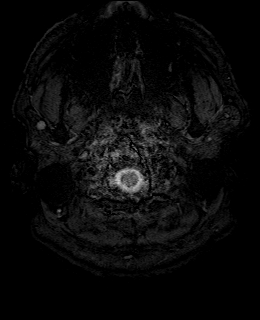
[im 26/52]
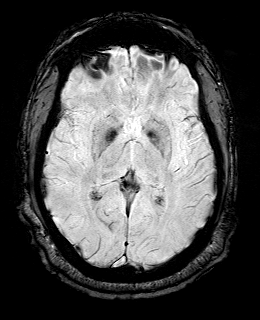
[im 52/52]
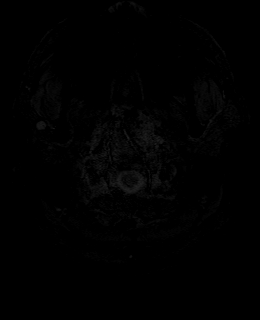

[Series 9: T1 · sagittal · 5.0mm · 0.75mm/px · 1 of 26 slices shown (1 of 2)]
[im 1/26]
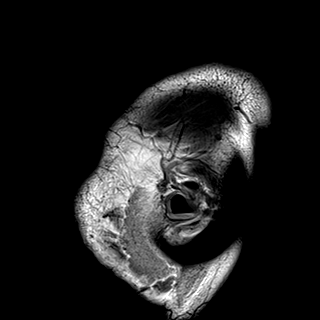

[Series 10: T2 · axial · 5.0mm · 0.62mm/px · 1 of 26 slices shown]
[im 1/26]
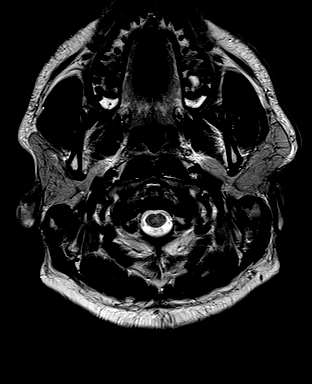

[Series 11: FLAIR · axial · 3.0mm · 0.75mm/px · z∈[-42,+111]mm · 3 of 52 slices shown]
[im 1/52]
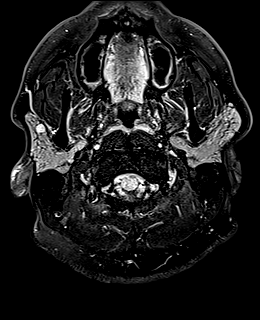
[im 26/52]
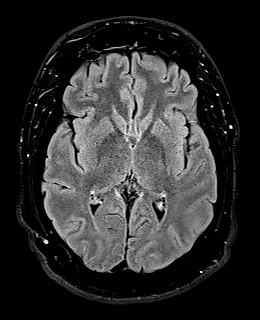
[im 52/52]
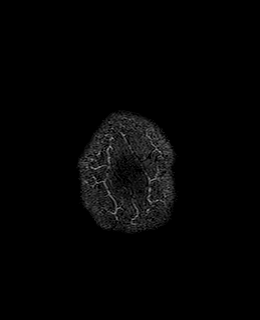

[Series 12: T1 · axial · 1.0mm · 0.94mm/px · z∈[-50,+109]mm · 8 of 160 slices shown (2 of 2)]
[im 1/160]
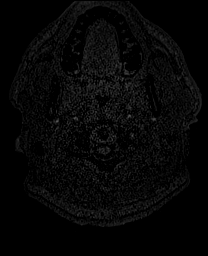
[im 23/160]
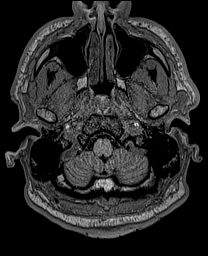
[im 46/160]
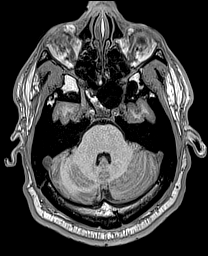
[im 69/160]
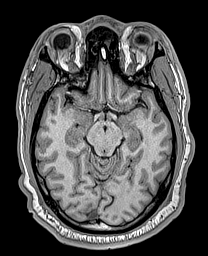
[im 91/160]
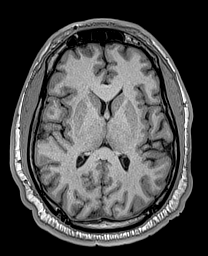
[im 114/160]
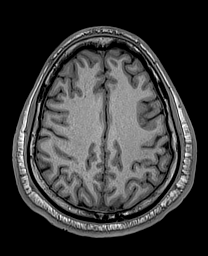
[im 137/160]
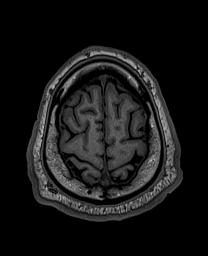
[im 160/160]
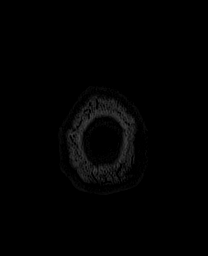

[Series 13: cor dwi_tracew · coronal · 5.0mm · 1.53mm/px · 3 of 58 slices shown]
[im 1/58]
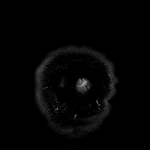
[im 29/58]
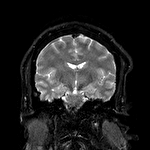
[im 58/58]
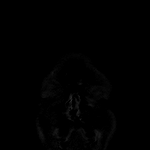

[Series 14: cor dwi_adc · coronal · 5.0mm · 1.53mm/px · 1 of 29 slices shown]
[im 1/29]
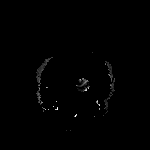

[Series 26: T2 post-contrast · coronal · 5.0mm · 0.57mm/px · 1 of 26 slices shown]
[im 1/26]
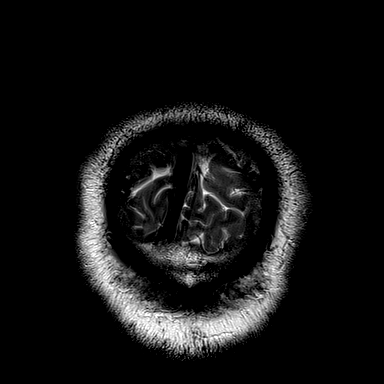

[Series 27: T1 post-contrast · axial · 1.0mm · 0.94mm/px · z∈[-37,+106]mm · 7 of 144 slices shown (1 of 3)]
[im 1/144]
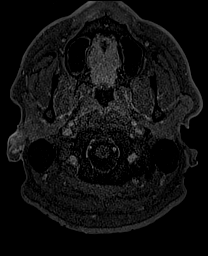
[im 24/144]
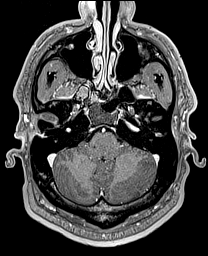
[im 48/144]
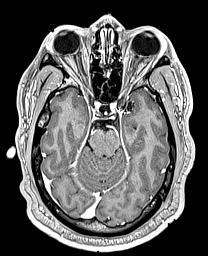
[im 72/144]
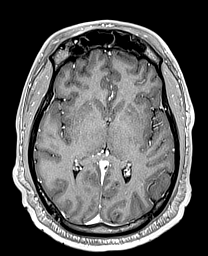
[im 96/144]
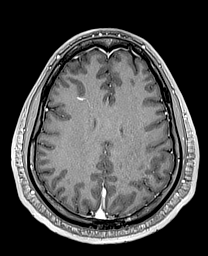
[im 120/144]
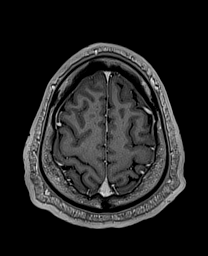
[im 144/144]
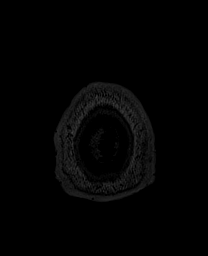

[Series 29: T1 post-contrast · coronal · 5.0mm · 0.43mm/px · 1 of 26 slices shown (2 of 3)]
[im 1/26]
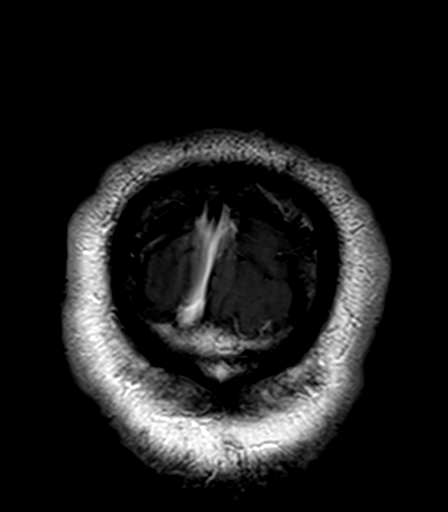

[Series 30: T1 post-contrast · sagittal · 5.0mm · 0.75mm/px · 1 of 24 slices shown (3 of 3)]
[im 1/24]
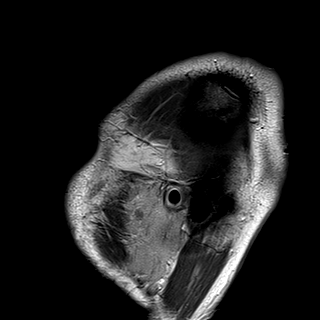

[39 of 48 positions shown; findings below may reference images not displayed]

FINDINGS: MRI HEAD FINDINGS

Brain: Is diffusion imaging does not show any acute or subacute
infarction. Brainstem and cerebellum are normal. Cerebral
hemispheres are normal. Minimal density of the basal ganglia
question by CT represents some physiologic calcification, not likely
of clinical relevance. No cortical or large vessel territory
abnormality. No mass lesion, hemorrhage, hydrocephalus or
extra-axial collection. Insignificant small venous angioma of the
right frontal lobe.

Vascular: Major vessels at the base of the brain show flow.

Skull and upper cervical spine: Negative

Sinuses/Orbits: Clear sinuses except for mild mucosal inflammatory
changes but no fluid opacification. Orbits are negative.

Other: Tiny left mastoid effusion.

MRA HEAD FINDINGS

Both internal carotid arteries are widely patent into the brain. No
siphon stenosis. The anterior and middle cerebral vessels are patent
without proximal stenosis, aneurysm or vascular malformation.

Both vertebral arteries are widely patent to the basilar. No basilar
stenosis. Posterior circulation branch vessels appear normal.
IMPRESSION: Normal MRI of the head. Normal intracranial MR angiography. No
abnormality seen to explain the presenting symptoms. There are
minimal mucosal inflammatory changes of the paranasal sinuses but no
advanced or likely significant sinus finding. There is most minimal
left mastoid effusion, also likely subclinical.

Hyperdensity associated with basal ganglia on CT represents minimal
calcification which is physiologic and usually not of any clinical
significance.

## 2021-01-15 IMAGING — CT CT HEAD W/O CM
3 series · 14 of 47 positions shown, 16 images · non-contrast
Comparison: None.

CLINICAL DATA: Migraine headache.

EXAM:
CT HEAD WITHOUT CONTRAST
TECHNIQUE: Contiguous axial images were obtained from the base of the skull
through the vertex without intravenous contrast.

[Series 2: head wo · axial · 0.47mm/px · z∈[-77,+58]mm · 8 of 33 slices shown, 10 images]
[im 3/33  brain]
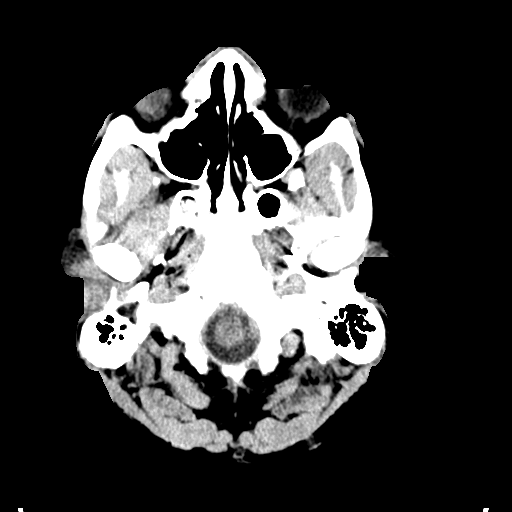
[im 3/33  bone]
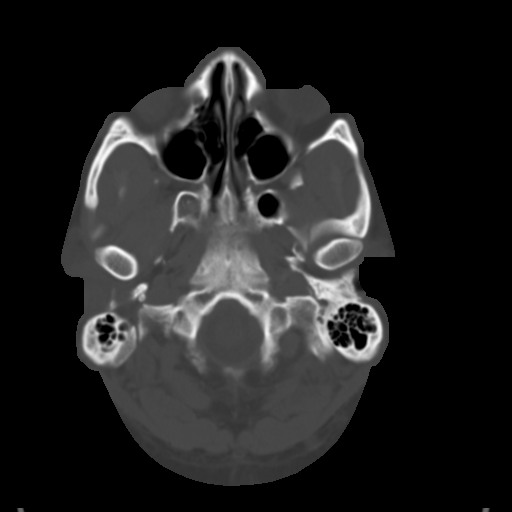
[im 7/33  brain]
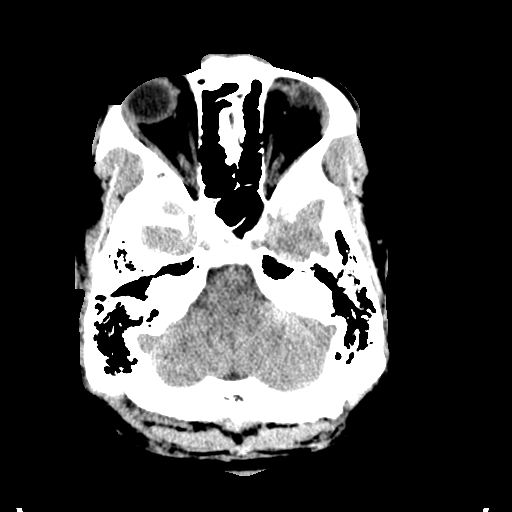
[im 10/33  brain]
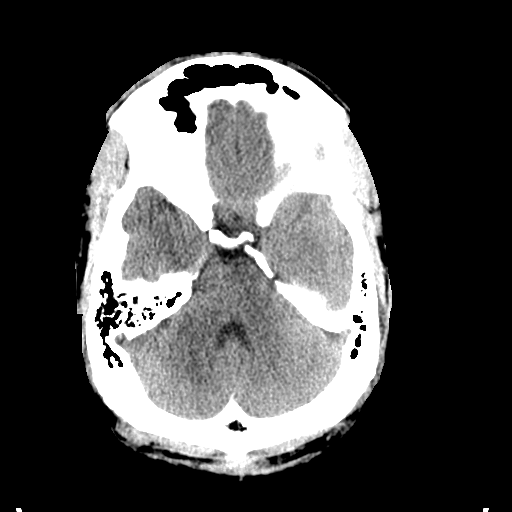
[im 15/33  brain]
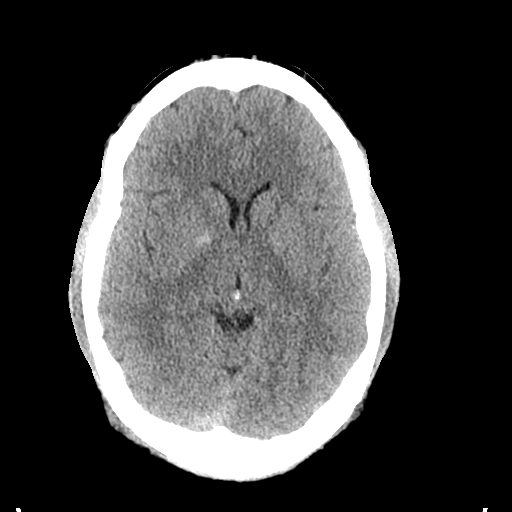
[im 18/33  brain]
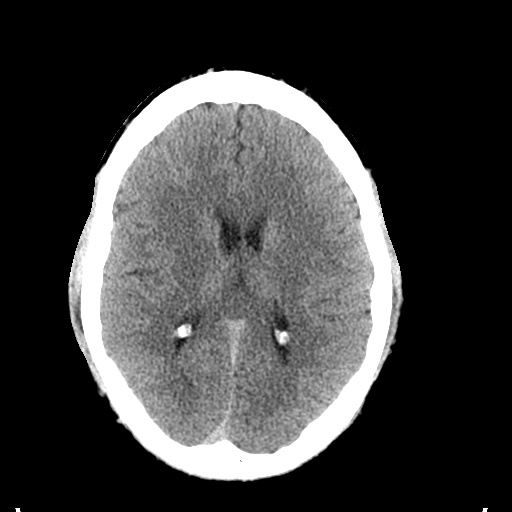
[im 18/33  bone]
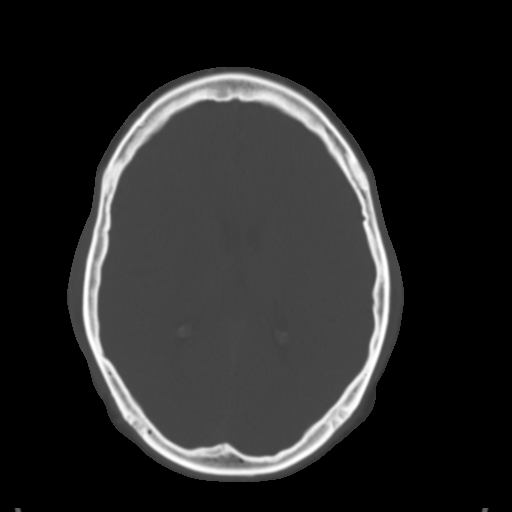
[im 23/33  brain]
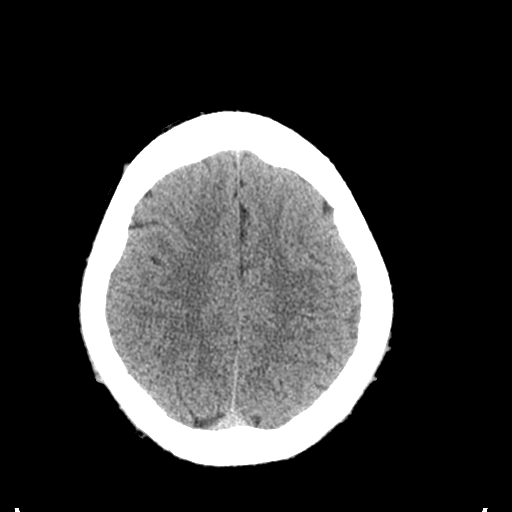
[im 26/33  brain]
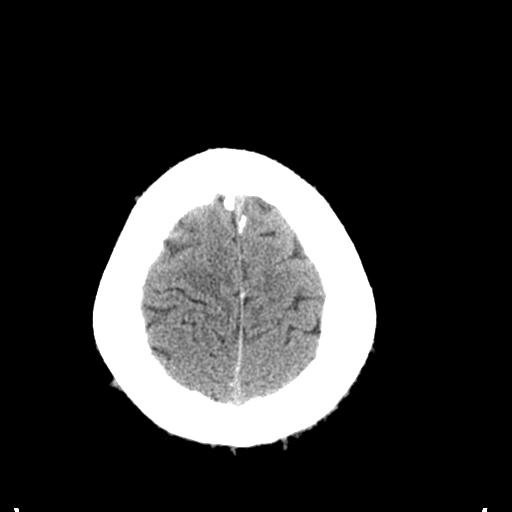
[im 30/33  brain]
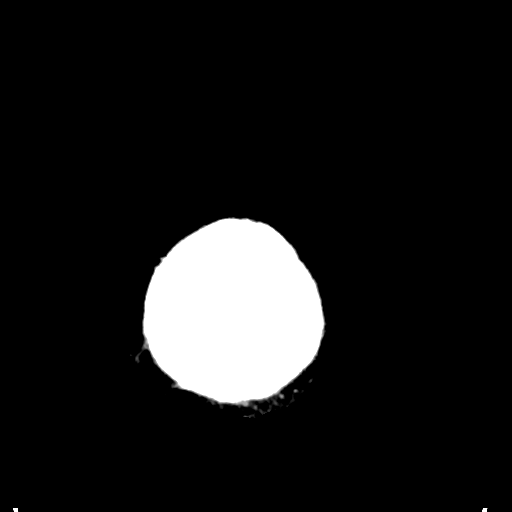

[Series 4: coronal soft tissue · coronal · 0.38mm/px · 3 of 72 slices shown]
[im 24/72  brain]
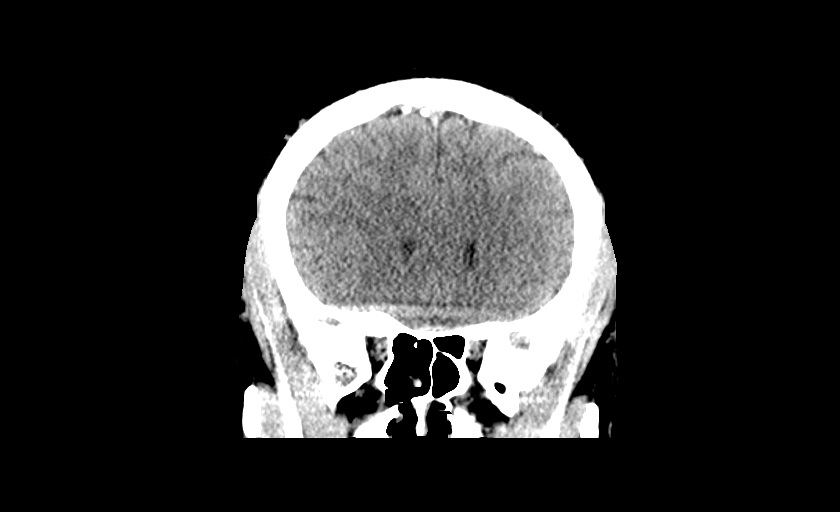
[im 32/72  brain]
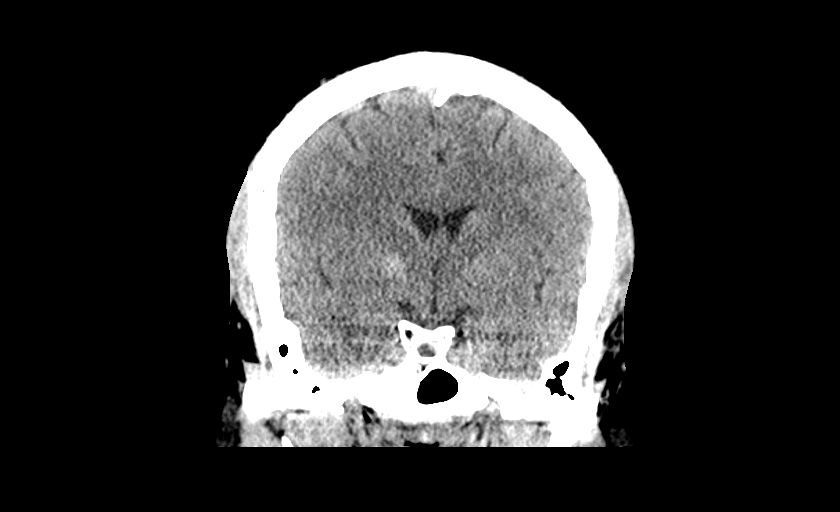
[im 40/72  brain]
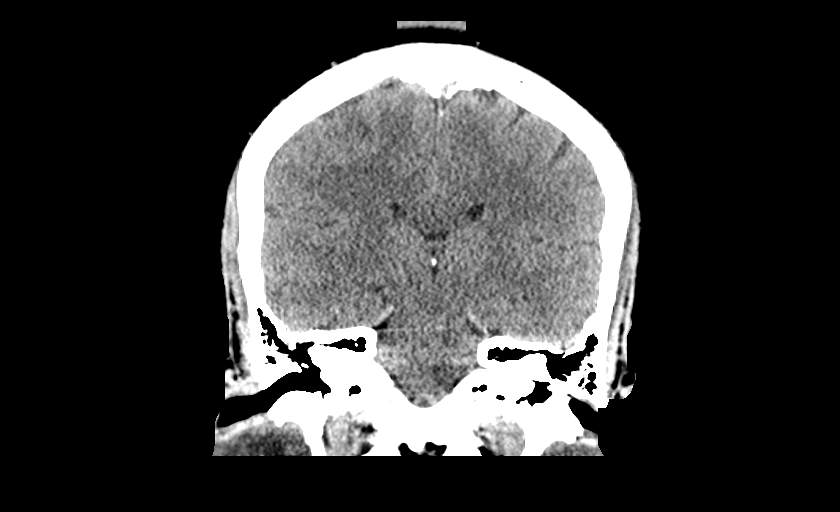

[Series 5: sagittal soft tissue · sagittal · 0.38mm/px · 3 of 54 slices shown]
[im 18/54  brain]
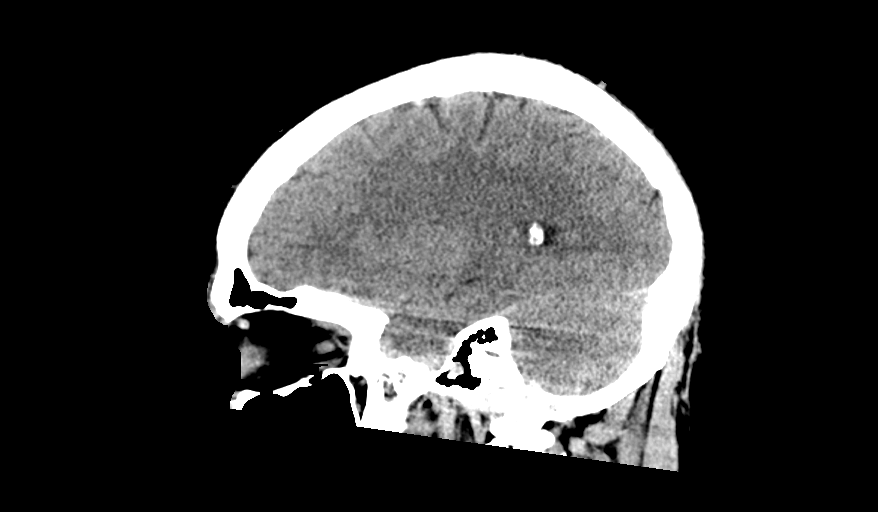
[im 27/54  brain]
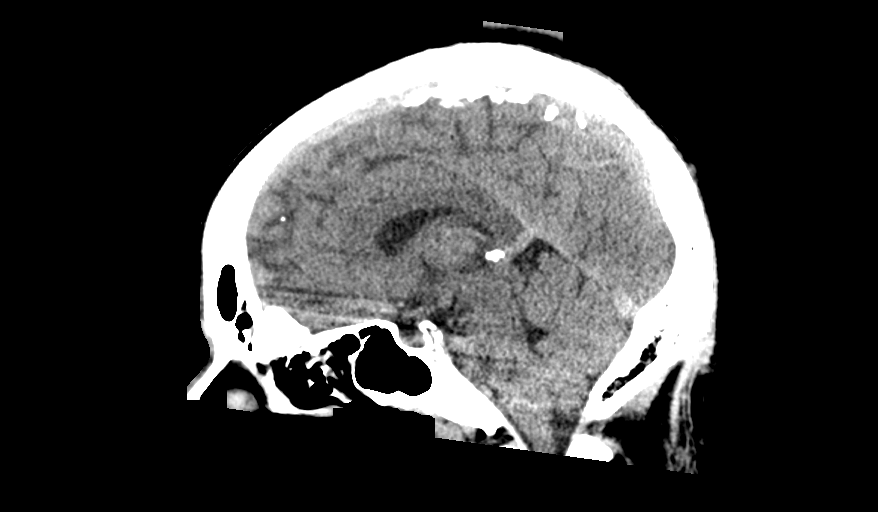
[im 36/54  brain]
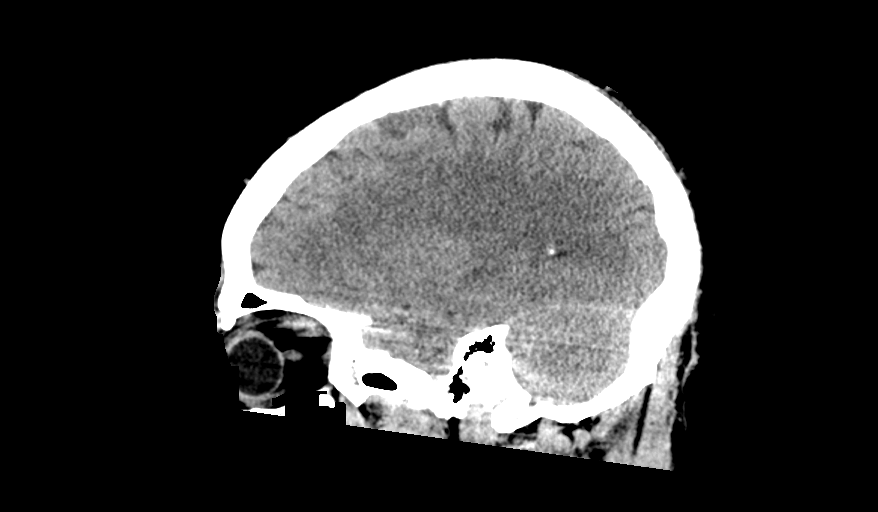

[14 of 47 positions shown; findings below may reference images not displayed]

FINDINGS: Brain: No mass effect or midline shift is noted. Ventricular size is
within normal limits. Ill-defined hyperdensity is noted in both
basal ganglia which most likely represents calcification or
metabolic etiology, but is significantly more prominent on the right
than the left. Hemorrhage cannot be excluded.

Vascular: No hyperdense vessel or unexpected calcification.

Skull: Normal. Negative for fracture or focal lesion.

Sinuses/Orbits: No acute finding.

Other: None.
IMPRESSION: Ill-defined hyperdensities are noted in both basal ganglia, which
may represent calcification or metabolic etiology. However, it is
significantly more prominent on the right than the left, and
hemorrhage cannot be excluded. Further evaluation with MRI is
recommended.

## 2021-01-15 IMAGING — MR MR HEAD WO/W CM
13 of 15 series · 39 of 48 positions shown · IV contrast (gadavist)
Comparison: Head CT same day

CLINICAL DATA: Headache over the last 10 days.  Abnormal head CT.

EXAM:
MRI HEAD WITHOUT AND WITH CONTRAST
MRA HEAD WITHOUT CONTRAST
TECHNIQUE: Multiplanar, multi-echo pulse sequences of the brain and surrounding
structures were acquired without and with intravenous contrast.
Angiographic images of the Circle of Willis were acquired using MRA
technique without intravenous contrast.
CONTRAST:  10mL GADAVIST GADOBUTROL 1 MMOL/ML IV SOLN

[Series 5: DWI · axial · 3.0mm · 1.36mm/px · z∈[-44,+109]mm · 6 of 104 slices shown (1 of 2)]
[im 1/104]
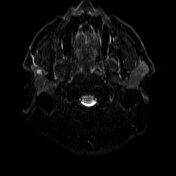
[im 21/104]
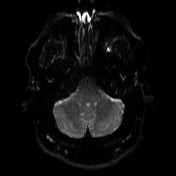
[im 42/104]
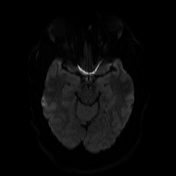
[im 62/104]
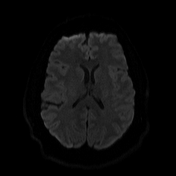
[im 83/104]
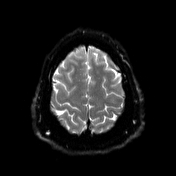
[im 104/104]
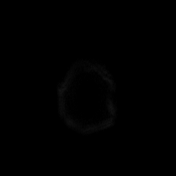

[Series 6: DWI · axial · 3.0mm · 1.36mm/px · z∈[-44,+109]mm · 3 of 52 slices shown (2 of 2)]
[im 1/52]
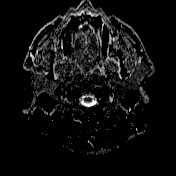
[im 26/52]
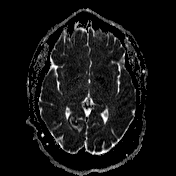
[im 52/52]
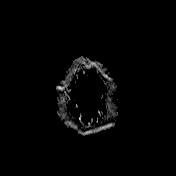

[Series 7: swi_images · axial · 3.0mm · 0.75mm/px · z∈[-41,+111]mm · 3 of 52 slices shown]
[im 1/52]
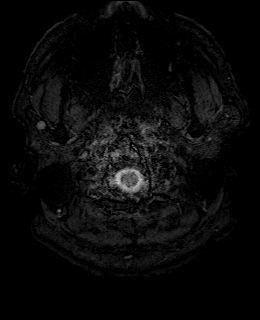
[im 26/52]
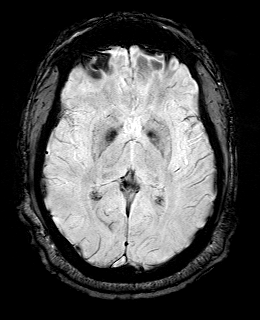
[im 52/52]
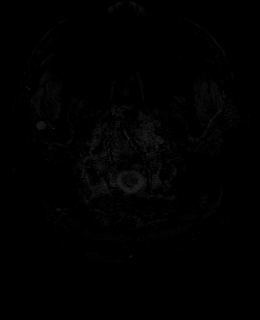

[Series 9: T1 · sagittal · 5.0mm · 0.75mm/px · 1 of 26 slices shown (1 of 2)]
[im 1/26]
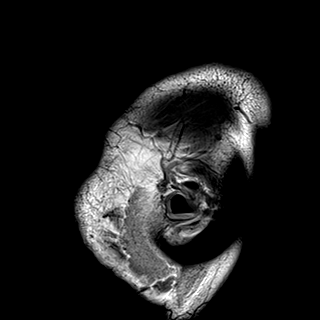

[Series 10: T2 · axial · 5.0mm · 0.62mm/px · 1 of 26 slices shown]
[im 1/26]
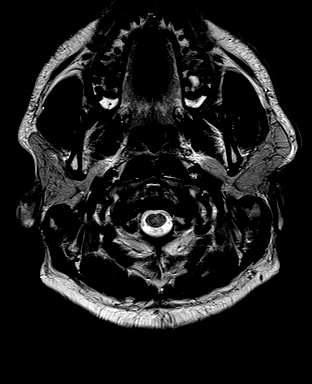

[Series 11: FLAIR · axial · 3.0mm · 0.75mm/px · z∈[-42,+111]mm · 3 of 52 slices shown]
[im 1/52]
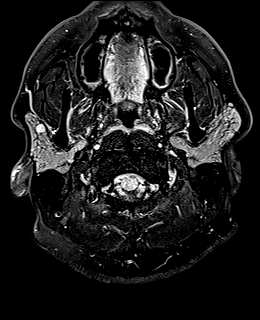
[im 26/52]
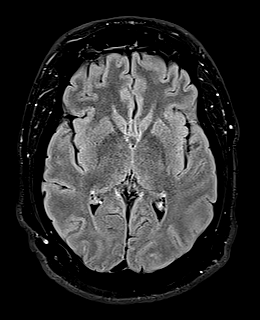
[im 52/52]
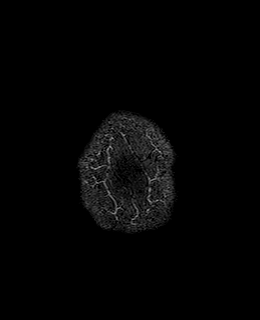

[Series 12: T1 · axial · 1.0mm · 0.94mm/px · z∈[-50,+109]mm · 8 of 160 slices shown (2 of 2)]
[im 1/160]
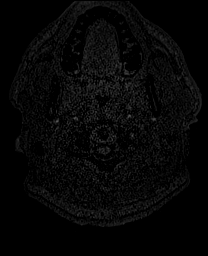
[im 23/160]
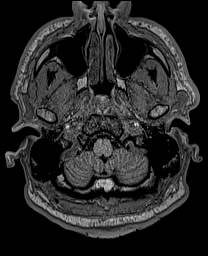
[im 46/160]
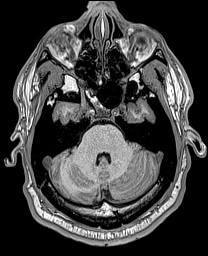
[im 69/160]
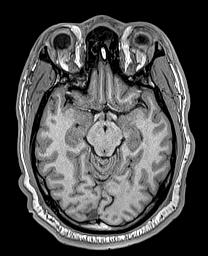
[im 91/160]
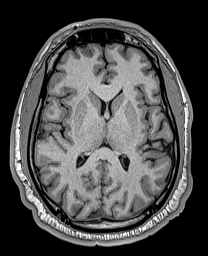
[im 114/160]
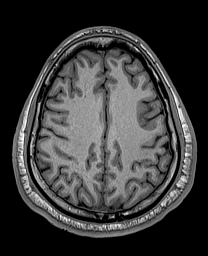
[im 137/160]
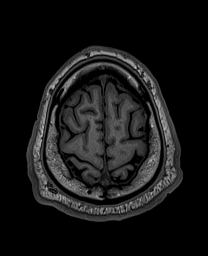
[im 160/160]
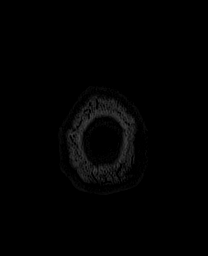

[Series 13: cor dwi_tracew · coronal · 5.0mm · 1.53mm/px · 3 of 58 slices shown]
[im 1/58]
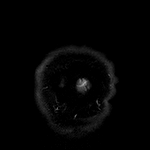
[im 29/58]
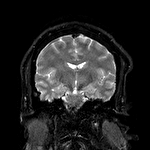
[im 58/58]
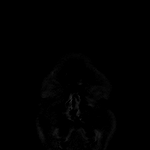

[Series 14: cor dwi_adc · coronal · 5.0mm · 1.53mm/px · 1 of 29 slices shown]
[im 1/29]
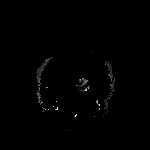

[Series 26: T2 post-contrast · coronal · 5.0mm · 0.57mm/px · 1 of 26 slices shown]
[im 1/26]
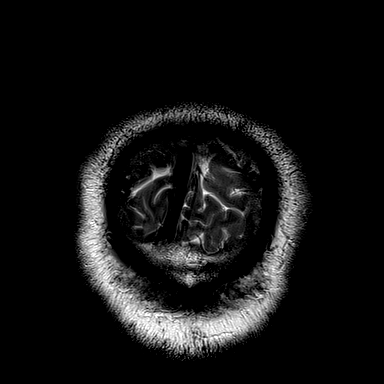

[Series 27: T1 post-contrast · axial · 1.0mm · 0.94mm/px · z∈[-37,+106]mm · 7 of 144 slices shown (1 of 3)]
[im 1/144]
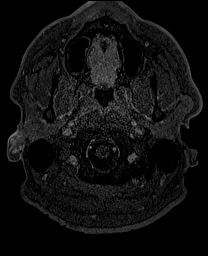
[im 24/144]
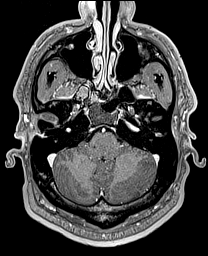
[im 48/144]
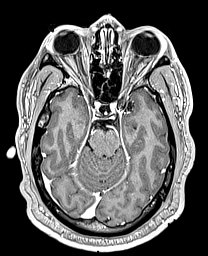
[im 72/144]
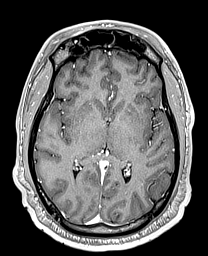
[im 96/144]
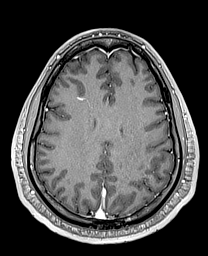
[im 120/144]
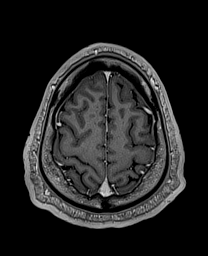
[im 144/144]
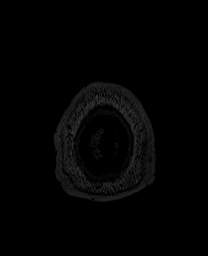

[Series 29: T1 post-contrast · coronal · 5.0mm · 0.43mm/px · 1 of 26 slices shown (2 of 3)]
[im 1/26]
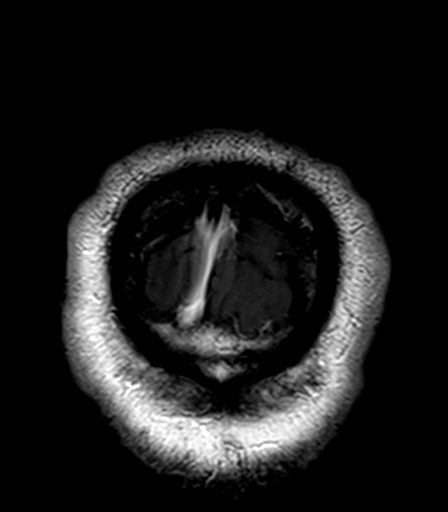

[Series 30: T1 post-contrast · sagittal · 5.0mm · 0.75mm/px · 1 of 24 slices shown (3 of 3)]
[im 1/24]
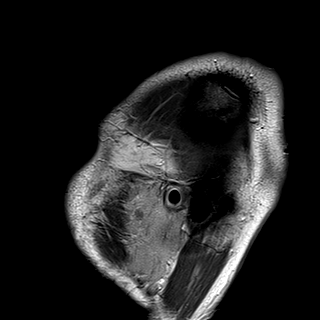

[39 of 48 positions shown; findings below may reference images not displayed]

FINDINGS: MRI HEAD FINDINGS

Brain: Is diffusion imaging does not show any acute or subacute
infarction. Brainstem and cerebellum are normal. Cerebral
hemispheres are normal. Minimal density of the basal ganglia
question by CT represents some physiologic calcification, not likely
of clinical relevance. No cortical or large vessel territory
abnormality. No mass lesion, hemorrhage, hydrocephalus or
extra-axial collection. Insignificant small venous angioma of the
right frontal lobe.

Vascular: Major vessels at the base of the brain show flow.

Skull and upper cervical spine: Negative

Sinuses/Orbits: Clear sinuses except for mild mucosal inflammatory
changes but no fluid opacification. Orbits are negative.

Other: Tiny left mastoid effusion.

MRA HEAD FINDINGS

Both internal carotid arteries are widely patent into the brain. No
siphon stenosis. The anterior and middle cerebral vessels are patent
without proximal stenosis, aneurysm or vascular malformation.

Both vertebral arteries are widely patent to the basilar. No basilar
stenosis. Posterior circulation branch vessels appear normal.
IMPRESSION: Normal MRI of the head. Normal intracranial MR angiography. No
abnormality seen to explain the presenting symptoms. There are
minimal mucosal inflammatory changes of the paranasal sinuses but no
advanced or likely significant sinus finding. There is most minimal
left mastoid effusion, also likely subclinical.

Hyperdensity associated with basal ganglia on CT represents minimal
calcification which is physiologic and usually not of any clinical
significance.

## 2021-01-15 MED ORDER — GADOBUTROL 1 MMOL/ML IV SOLN
10.0000 mL | Freq: Once | INTRAVENOUS | Status: AC | PRN
  Administered 2021-01-15: 10 mL via INTRAVENOUS

## 2021-01-15 MED ORDER — SODIUM CHLORIDE 0.9 % IV BOLUS
1000.0000 mL | Freq: Once | INTRAVENOUS | Status: AC
  Administered 2021-01-15: 1000 mL via INTRAVENOUS

## 2021-01-15 MED ORDER — AMLODIPINE BESYLATE 5 MG PO TABS: 5.0000 mg | ORAL_TABLET | Freq: Every day | ORAL | 0 refills | Status: DC

## 2021-01-15 MED ORDER — DIPHENHYDRAMINE HCL 50 MG/ML IJ SOLN
25.0000 mg | Freq: Once | INTRAMUSCULAR | Status: AC
  Administered 2021-01-15: 25 mg via INTRAVENOUS
  Filled 2021-01-15: qty 1

## 2021-01-15 MED ORDER — PROCHLORPERAZINE EDISYLATE 10 MG/2ML IJ SOLN
10.0000 mg | Freq: Once | INTRAMUSCULAR | Status: AC
  Administered 2021-01-15: 10 mg via INTRAVENOUS
  Filled 2021-01-15: qty 2

## 2021-01-15 NOTE — ED Provider Notes (Signed)
I provided a substantive portion of the care of this patient.  I personally performed the entirety of the history for this encounter.     Patient reports has been getting a severe headache off and on for 10 days.  He reports it is right sided sharp and intense.  It has been coming and going.  Lasting an hour or so.  Between episodes, he reports there is some headache but he thinks he just then can ignore it.  This morning he had the worst episode yet.  Severe sharp pain at the right temple and behind the eye.  He reports has been constant since about midnight.  No vomiting.  Light sensitivity.  No cough.  No fever no body aches.  Patient reports he is generally felt well aside from his headache.  He has been to usual activities.  He ate dinner with his wife yesterday evening.  He was feeling well when he went to bed.  He has not been doing anything to treat these headaches.  He reports yesterday he took his first dose of Aleve.  Patient reports he does smoke cigarettes.  Rare social alcohol use, denies drugs of abuse.  Reports he does have family members with hypertension.  He has 6 siblings.  Parents and siblings living without significant medical problems.  Patient is alert.  He appears to be in pain.  Mental status is clear.  Pupils equally round reactive to light extraocular motion intact.  Oral cavity is clear.  Right TM normal with minimal serous effusion.  No appearance of soft tissue lesions or changes on the scalp or face or ear.  Neck supple.  Heart regular.  Lungs clear.  All movements coordinated purposeful symmetric.  I agree with plan of management.   Arby Barrette, MD 01/15/21 [phone]

## 2021-01-15 NOTE — ED Notes (Signed)
Patient transported to CT 

## 2021-01-15 NOTE — Discharge Instructions (Addendum)
You were prescribed blood pressure medication on today's visit.  Please take 1 tablet daily for the next 30 days.  Please be aware in order for this medication to work appropriately, you will need to take it for least 7 to 14 days.  You will need to schedule an appointment with a primary care physician, in order to obtain further management of your high blood pressure.  If you experience any worsening headache, nausea, worsening symptoms you will need to return to the emergency department.  If your headaches return, and seem to be more recurrent you will probably need a follow-up with neurology, their phone number is listed and attached to your chart.

## 2021-01-15 NOTE — ED Notes (Signed)
Pt in MRI.

## 2021-01-15 NOTE — ED Provider Notes (Signed)
[facility]-EMERGENCY DEPT Provider Note   CSN: [id number] Arrival date & time: 01/15/21  6834     History Chief Complaint  Patient presents with   Headache    Matthew Kaiser is a 30 y.o. male.  30 y.o male with no PMH presents to the ED with a chief complaint of headache x 5 hours. Patient reports headache came on suddenly, he describes this as a constant sharp frontal to right-sided temporal headache.  Exacerbated with light along with sound.  Also reports taking some Aleve around 3 AM this morning, which did not help with the headache then.  Does report, headache a week prior, which he reports was likely due to him being outside in the heat most of the day.  There was an episode of vomiting with this incident.  States, he was mostly lounging around yesterday without any exposure to the heat.  He is currently a daily tobacco smoker.  Does report previous headaches in the past, but no diagnosed history of migraines, no headaches similar to these. No vomiting, no dizziness, no focal weakness, or changes in vision. Of note, prior physical over a year ago, where he was noted to be hypertensive, did not have this addressed, unknown how long his blood pressure has been running as elevated.  The history is provided by the patient.  Headache Pain location:  Frontal and R temporal Quality:  Sharp (sharp pressure) Radiates to:  Does not radiate Severity currently:  10/10 Severity at highest:  10/10 Onset quality:  Sudden Duration:  5 hours Timing:  Constant Progression:  Worsening Chronicity:  New Similar to prior headaches: no   Context: bright light, eating and loud noise   Relieved by:  Nothing Worsened by:  Light and sound Ineffective treatments:  NSAIDs Associated symptoms: photophobia   Associated symptoms: no abdominal pain, no back pain, no diarrhea, no dizziness, no fever, no nausea, no neck stiffness, no visual change, no vomiting and no weakness   Risk  factors: no anger, no family hx of SAH and lifestyle not sedentary        No family history on file.  Social History   Tobacco Use   Smoking status: Every Day    Packs/day: 0.50    Types: Cigarettes   Smokeless tobacco: Current  Substance Use Topics   Alcohol use: Yes    Comment: socially   Drug use: Yes    Types: Marijuana    Home Medications Prior to Admission medications   Medication Sig Start Date End Date Taking? Authorizing Provider  amLODipine (NORVASC) 5 MG tablet Take 1 tablet (5 mg total) by mouth daily. 01/15/21 02/14/21 Yes Chessie Neuharth, PA-C  cephALEXin (KEFLEX) 500 MG capsule Take 1 capsule (500 mg total) by mouth 4 (four) times daily. 11/28/18   Palumbo, April, MD  HYDROcodone-acetaminophen (NORCO) 5-325 MG tablet Take 1 tablet by mouth every 6 (six) hours as needed. 06/07/19   Felecia Shelling, DPM  ibuprofen (ADVIL) 400 MG tablet Take 1 tablet (400 mg total) by mouth every 6 (six) hours as needed. 11/28/18   Palumbo, April, MD  mupirocin cream (BACTROBAN) 2 % Apply 1 application topically 2 (two) times daily. 11/28/18   Palumbo, April, MD  naproxen (NAPROSYN) 500 MG tablet Take 1 tablet (500 mg total) by mouth 2 (two) times daily. 05/20/19   Maxwell Caul, PA-C  traMADol (ULTRAM) 50 MG tablet Take 1 tablet (50 mg total) by mouth every 6 (six) hours as needed.  04/06/15   Dione Booze, MD    Allergies    Patient has no known allergies.  Review of Systems   Review of Systems  Constitutional:  Negative for fever.  Eyes:  Positive for photophobia. Negative for visual disturbance.  Respiratory:  Negative for shortness of breath.   Cardiovascular:  Negative for chest pain.  Gastrointestinal:  Negative for abdominal pain, diarrhea, nausea and vomiting.  Genitourinary:  Negative for flank pain.  Musculoskeletal:  Negative for back pain and neck stiffness.  Neurological:  Positive for headaches. Negative for dizziness, speech difficulty, weakness and light-headedness.   All other systems reviewed and are negative.  Physical Exam Updated Vital Signs BP (!) 136/103   Pulse 61   Temp 98.1 F (36.7 C) (Oral)   Resp 18   SpO2 99%   Physical Exam Vitals and nursing note reviewed.  Constitutional:      Appearance: He is well-developed.  HENT:     Head: Normocephalic and atraumatic.  Eyes:     Extraocular Movements: Extraocular movements intact.     Pupils: Pupils are equal, round, and reactive to light. Pupils are equal.  Pulmonary:     Effort: Pulmonary effort is normal.     Breath sounds: No wheezing.  Abdominal:     General: There is no distension.     Palpations: Abdomen is soft.  Musculoskeletal:     Cervical back: Normal range of motion and neck supple.  Skin:    General: Skin is warm and dry.  Neurological:     Mental Status: He is alert and oriented to person, place, and time.     GCS: GCS eye subscore is 4. GCS verbal subscore is 5. GCS motor subscore is 6.     Motor: No weakness.     Comments: Alert, oriented, thought content appropriate. Speech fluent without evidence of aphasia. Able to follow 2 step commands without difficulty.  Cranial Nerves:  II:  Peripheral visual fields grossly normal, pupils, round, reactive to light III,IV, VI: ptosis not present, extra-ocular motions intact bilaterally  V,VII: smile symmetric, facial light touch sensation equal VIII: hearing grossly normal bilaterally  IX,X: midline uvula rise  XI: bilateral shoulder shrug equal and strong XII: midline tongue extension  Motor:  5/5 in upper and lower extremities bilaterally including strong and equal grip strength and dorsiflexion/plantar flexion Sensory: light touch normal in all extremities.  Cerebellar: normal finger-to-nose with bilateral upper extremities, pronator drift negative Gait: normal gait and balance      ED Results / Procedures / Treatments   Labs (all labs ordered are listed, but only abnormal results are displayed) Labs Reviewed   CBC WITH DIFFERENTIAL/PLATELET - Abnormal; Notable for the following components:      Result Value   WBC 12.3 (*)    MCHC 36.5 (*)    Monocytes Absolute 1.2 (*)    All other components within normal limits  COMPREHENSIVE METABOLIC PANEL - Abnormal; Notable for the following components:   Glucose, Bld 109 (*)    All other components within normal limits    EKG None  Radiology CT Head Wo Contrast  Result Date: 01/15/2021 CLINICAL DATA:  Migraine headache. EXAM: CT HEAD WITHOUT CONTRAST TECHNIQUE: Contiguous axial images were obtained from the base of the skull through the vertex without intravenous contrast. COMPARISON:  None. FINDINGS: Brain: No mass effect or midline shift is noted. Ventricular size is within normal limits. Ill-defined hyperdensity is noted in both basal ganglia which most likely represents  calcification or metabolic etiology, but is significantly more prominent on the right than the left. Hemorrhage cannot be excluded. Vascular: No hyperdense vessel or unexpected calcification. Skull: Normal. Negative for fracture or focal lesion. Sinuses/Orbits: No acute finding. Other: None. IMPRESSION: Ill-defined hyperdensities are noted in both basal ganglia, which may represent calcification or metabolic etiology. However, it is significantly more prominent on the right than the left, and hemorrhage cannot be excluded. Further evaluation with MRI is recommended. Electronically Signed   By: Lupita Raider M.D.   On: 01/15/2021 08:43   MR ANGIO HEAD WO CONTRAST  Result Date: 01/15/2021 CLINICAL DATA:  Headache over the last 10 days.  Abnormal head CT. EXAM: MRI HEAD WITHOUT AND WITH CONTRAST MRA HEAD WITHOUT CONTRAST TECHNIQUE: Multiplanar, multi-echo pulse sequences of the brain and surrounding structures were acquired without and with intravenous contrast. Angiographic images of the Circle of Willis were acquired using MRA technique without intravenous contrast. CONTRAST:  88mL  GADAVIST GADOBUTROL 1 MMOL/ML IV SOLN COMPARISON:  Head CT same day FINDINGS: MRI HEAD FINDINGS Brain: Is diffusion imaging does not show any acute or subacute infarction. Brainstem and cerebellum are normal. Cerebral hemispheres are normal. Minimal density of the basal ganglia question by CT represents some physiologic calcification, not likely of clinical relevance. No cortical or large vessel territory abnormality. No mass lesion, hemorrhage, hydrocephalus or extra-axial collection. Insignificant small venous angioma of the right frontal lobe. Vascular: Major vessels at the base of the brain show flow. Skull and upper cervical spine: Negative Sinuses/Orbits: Clear sinuses except for mild mucosal inflammatory changes but no fluid opacification. Orbits are negative. Other: Tiny left mastoid effusion. MRA HEAD FINDINGS Both internal carotid arteries are widely patent into the brain. No siphon stenosis. The anterior and middle cerebral vessels are patent without proximal stenosis, aneurysm or vascular malformation. Both vertebral arteries are widely patent to the basilar. No basilar stenosis. Posterior circulation branch vessels appear normal. IMPRESSION: Normal MRI of the head. Normal intracranial MR angiography. No abnormality seen to explain the presenting symptoms. There are minimal mucosal inflammatory changes of the paranasal sinuses but no advanced or likely significant sinus finding. There is most minimal left mastoid effusion, also likely subclinical. Hyperdensity associated with basal ganglia on CT represents minimal calcification which is physiologic and usually not of any clinical significance. Electronically Signed   By: Paulina Fusi M.D.   On: 01/15/2021 11:50   MR Brain W and Wo Contrast  Result Date: 01/15/2021 CLINICAL DATA:  Headache over the last 10 days.  Abnormal head CT. EXAM: MRI HEAD WITHOUT AND WITH CONTRAST MRA HEAD WITHOUT CONTRAST TECHNIQUE: Multiplanar, multi-echo pulse sequences of  the brain and surrounding structures were acquired without and with intravenous contrast. Angiographic images of the Circle of Willis were acquired using MRA technique without intravenous contrast. CONTRAST:  3mL GADAVIST GADOBUTROL 1 MMOL/ML IV SOLN COMPARISON:  Head CT same day FINDINGS: MRI HEAD FINDINGS Brain: Is diffusion imaging does not show any acute or subacute infarction. Brainstem and cerebellum are normal. Cerebral hemispheres are normal. Minimal density of the basal ganglia question by CT represents some physiologic calcification, not likely of clinical relevance. No cortical or large vessel territory abnormality. No mass lesion, hemorrhage, hydrocephalus or extra-axial collection. Insignificant small venous angioma of the right frontal lobe. Vascular: Major vessels at the base of the brain show flow. Skull and upper cervical spine: Negative Sinuses/Orbits: Clear sinuses except for mild mucosal inflammatory changes but no fluid opacification. Orbits are negative. Other: Tiny left  mastoid effusion. MRA HEAD FINDINGS Both internal carotid arteries are widely patent into the brain. No siphon stenosis. The anterior and middle cerebral vessels are patent without proximal stenosis, aneurysm or vascular malformation. Both vertebral arteries are widely patent to the basilar. No basilar stenosis. Posterior circulation branch vessels appear normal. IMPRESSION: Normal MRI of the head. Normal intracranial MR angiography. No abnormality seen to explain the presenting symptoms. There are minimal mucosal inflammatory changes of the paranasal sinuses but no advanced or likely significant sinus finding. There is most minimal left mastoid effusion, also likely subclinical. Hyperdensity associated with basal ganglia on CT represents minimal calcification which is physiologic and usually not of any clinical significance. Electronically Signed   By: Paulina Fusi M.D.   On: 01/15/2021 11:50    Procedures Procedures    Medications Ordered in ED Medications  prochlorperazine (COMPAZINE) injection 10 mg (10 mg Intravenous Given 01/15/21 0801)  diphenhydrAMINE (BENADRYL) injection 25 mg (25 mg Intravenous Given 01/15/21 0801)  sodium chloride 0.9 % bolus 1,000 mL (1,000 mLs Intravenous New Bag/Given 01/15/21 0759)  gadobutrol (GADAVIST) 1 MMOL/ML injection 10 mL (10 mLs Intravenous Contrast Given 01/15/21 1125)    ED Course  I have reviewed the triage vital signs and the nursing notes.  Pertinent labs & imaging results that were available during my care of the patient were reviewed by me and considered in my medical decision making (see chart for details).    MDM Rules/Calculators/A&P  Patient with no pertinent past medical history presents to the ED with a chief complaint of headache of sudden onset 5 hours ago.  Patient reports a prior history of migraines, however this was not diagnosed.  States the headache is sharp in nature, mainly on the right side of his head along with the right frontal aspect, has taken Aleve with no improvement in his symptoms until arrival in the ED where he feels that the headache is likely improving some.  Similar headache in nature last week, but did have a vomiting episode when this occurred, states that he was outside working in the heat.  On today's arrival, patient does have persistent photophobia, phonophobia, is covering his head with a blanket through most of her HPI.  During evaluation he is able to move all upper and lower extremities.  No decrease in sensation, no weakness noted on my exam.  Gait is intact as he was ambulatory from the waiting room.  We discussed symptomatic and, along with imaging as headache occurred suddenly.  No prior history of aneurysms, no family history of aneurysms in the past.  9:05 AM Spoke to Dr. Selina Cooley of neurology who recommended MRI brain, MRA brain as well to rule out any mass, or hemorrhage although less concerning for this.   MRI  bRain:  Normal MRI of the head. Normal intracranial MR angiography. No  abnormality seen to explain the presenting symptoms. There are  minimal mucosal inflammatory changes of the paranasal sinuses but no  advanced or likely significant sinus finding. There is most minimal  left mastoid effusion, also likely subclinical.     Hyperdensity associated with basal ganglia on CT represents minimal  calcification which is physiologic and usually not of any clinical  significance.      11:58 AM patient reassessed by me, with improvement and resolution of his headache.  We did discuss establishing primary care in order to obtain a prescription and recheck for his elevated blood pressure, which has been significantly elevated in the last year.  Patient is agreeable of treatment and management, have also discussed this with my attending Dr. Clarice PolePfeifer who agrees with plan and management.  Patient is stable for discharge at this time.  Portions of this note were generated with Scientist, clinical (histocompatibility and immunogenetics)Dragon dictation software. Dictation errors may occur despite best attempts at proofreading.  Final Clinical Impression(s) / ED Diagnoses Final diagnoses:  Bad headache  Secondary hypertension    Rx / DC Orders ED Discharge Orders          Ordered    amLODipine (NORVASC) 5 MG tablet  Daily        01/15/21 1219             Claude MangesSoto, Akhilesh Sassone, PA-C 01/15/21 1236    Arby BarrettePfeiffer, Marcy, MD 01/23/21 [id number]

## 2021-01-15 NOTE — ED Triage Notes (Addendum)
Patient c/o headache to primarily right side x10 days. States light sensitivity and eye pain.

## 2021-01-15 NOTE — ED Notes (Addendum)
Pt back from MRI 

## 2021-01-20 ENCOUNTER — Emergency Department (HOSPITAL_COMMUNITY)
Admission: EM | Admit: 2021-01-20 | Discharge: 2021-01-20 | Disposition: A | Discharge status: Home / Self Care | Payer: Medicaid Other | Attending: Emergency Medicine | Admitting: Emergency Medicine

## 2021-01-20 ENCOUNTER — Encounter (HOSPITAL_COMMUNITY): Payer: Self-pay

## 2021-01-20 ENCOUNTER — Other Ambulatory Visit: Payer: Self-pay

## 2021-01-20 DIAGNOSIS — F17210 Nicotine dependence, cigarettes, uncomplicated: Secondary | ICD-10-CM | POA: Insufficient documentation

## 2021-01-20 DIAGNOSIS — Z79899 Other long term (current) drug therapy: Secondary | ICD-10-CM | POA: Insufficient documentation

## 2021-01-20 DIAGNOSIS — G44019 Episodic cluster headache, not intractable: Secondary | ICD-10-CM | POA: Insufficient documentation

## 2021-01-20 MED ORDER — SUMATRIPTAN 20 MG/ACT NA SOLN
NASAL | 0 refills | Status: DC
  Filled 2021-01-22: qty 6, 30d supply, fill #0

## 2021-01-20 MED ORDER — SUMATRIPTAN SUCCINATE 6 MG/0.5ML ~~LOC~~ SOLN
6.0000 mg | Freq: Once | SUBCUTANEOUS | Status: AC
  Administered 2021-01-20: 6 mg via SUBCUTANEOUS
  Filled 2021-01-20: qty 0.5

## 2021-01-20 NOTE — ED Provider Notes (Signed)
[facility]-EMERGENCY DEPT Provider Note: Lowella Dell, MD, FACEP  CSN: [id number] MRN: [id number] ARRIVAL: 01/20/21 at 0236 ROOM: WA14/[facility]14   CHIEF COMPLAINT  Migraine   HISTORY OF PRESENT ILLNESS  01/20/21 3:01 AM Matthew Kaiser is a 30 y.o. male who was seen on 01/15/2021 for severe headache that have been occurring off and on for 10 days.  The pain was right-sided, lasting about an hour.  He describes it as a severe sharp pain on the right temple and behind the eye.  It is associated with photophobia but no vomiting or URI symptoms.  He was given a migraine cocktail IV and had a work-up that included MRI of the brain with and without contrast which were unremarkable.  He got relief with the migraine cocktail.    He returns with continued episodes of headache.  The come and go lasting an hour or more so at times.  He currently has a headache and it is located around the right eye it is sharp and stabbing in nature.  He rates it as a 9 out of 10.  It is worse with exposure to light and he is preferring to keep his head covered or be in a dark room.  He has had nausea and vomiting with this but no focal neurologic deficits.  He denies ptosis or watering of the right eye.  History reviewed. No pertinent past medical history.  History reviewed. No pertinent surgical history.  History reviewed. No pertinent family history.  Social History   Tobacco Use   Smoking status: Every Day    Packs/day: 0.50    Types: Cigarettes   Smokeless tobacco: Current  Substance Use Topics   Alcohol use: Yes    Comment: socially   Drug use: Yes    Types: Marijuana    Prior to Admission medications   Medication Sig Start Date End Date Taking? Authorizing Provider  amLODipine (NORVASC) 5 MG tablet Take 1 tablet (5 mg total) by mouth daily. 01/15/21 02/14/21  Claude Manges, PA-C    Allergies Patient has no known allergies.   REVIEW OF SYSTEMS  Negative except as noted here or in the History of Present  Illness.   PHYSICAL EXAMINATION  Initial Vital Signs Blood pressure (!) 156/99, pulse 69, temperature 98.1 F (36.7 C), resp. rate 18, height 6\' 4"  (1.93 m), weight 122.5 kg, SpO2 98 %.  Examination General: Well-developed, well-nourished male in no acute distress; appearance consistent with age of record HENT: normocephalic; atraumatic Eyes: Photophobia Neck: supple Heart: regular rate and rhythm Lungs: clear to auscultation bilaterally Abdomen: soft; nondistended; nontender; bowel sounds present Extremities: No deformity; full range of motion; pulses normal Neurologic: Awake, alert and oriented; motor function intact in all extremities and symmetric; no facial droop Skin: Warm and dry Psychiatric: Flat affect   RESULTS  Summary of this visit's results, reviewed and interpreted by myself:   EKG Interpretation  Date/Time:    Ventricular Rate:    PR Interval:    QRS Duration:   QT Interval:    QTC Calculation:   R Axis:     Text Interpretation:         Laboratory Studies: No results found for this or any previous visit (from the past 24 hour(s)). Imaging Studies: No results found.  ED COURSE and MDM  Nursing notes, initial and subsequent vitals signs, including pulse oximetry, reviewed and interpreted by myself.  Vitals:   01/20/21 0404 01/20/21 0430 01/20/21 0500 01/20/21 0600  BP: 122/83  125/89 127/87 117/83  Pulse: 64 74 65 (!) 59  Resp: 16 17 16    Temp:      SpO2: 100% 99% 100% 100%  Weight:      Height:       Medications  SUMAtriptan (IMITREX) injection 6 mg (6 mg Subcutaneous Given 01/20/21 0329)   5:58 AM Patient's pain down to a 2 out of 10 after subcu sumatriptan and 100% oxygen administration.  His presentation is consistent with cluster headaches.  He already had a significant work-up that ruled out any acute brain pathology.  We will treat him with sumatriptan nasal spray.   PROCEDURES  Procedures   ED DIAGNOSES     ICD-10-CM   1.  Episodic cluster headache, not intractable  G44.019          Aydia Maj, MD 01/20/21 [phone]

## 2021-01-20 NOTE — ED Notes (Signed)
NRB applied to pt for migraine treatment

## 2021-01-20 NOTE — ED Notes (Signed)
Pt states improvement in headache

## 2021-01-20 NOTE — ED Triage Notes (Addendum)
Pt was seen here on 7/20 for migraines and HTN. Pt prescribed BP meds. Pt has been compliant with medications. Pt states the migraine has not gone away since 7/20. Pt endorses N/V with migraine as well as light sensitivity. Pt is A&Ox4. Pt states pain is only on the right side of his head. Pt states he is unable to open his eyes.

## 2021-01-22 ENCOUNTER — Other Ambulatory Visit (HOSPITAL_COMMUNITY): Payer: Self-pay

## 2021-01-22 ENCOUNTER — Telehealth: Payer: Self-pay | Admitting: Surgery

## 2021-01-22 MED ORDER — SUMATRIPTAN SUCCINATE 25 MG PO TABS
ORAL_TABLET | ORAL | 0 refills | Status: DC
  Filled 2021-01-22: qty 6, 30d supply, fill #0

## 2021-01-22 NOTE — Telephone Encounter (Signed)
ED RNCM received call from [facility] OP pharmacy concerning prescriptions clarification. RNCM provided clarification no further ED RNCM needs identified

## 2021-01-26 ENCOUNTER — Encounter (HOSPITAL_COMMUNITY): Payer: Self-pay | Admitting: Emergency Medicine

## 2021-01-26 ENCOUNTER — Other Ambulatory Visit: Payer: Self-pay

## 2021-01-26 ENCOUNTER — Emergency Department (HOSPITAL_COMMUNITY)
Admission: EM | Admit: 2021-01-26 | Discharge: 2021-01-26 | Disposition: A | Discharge status: Home / Self Care | Payer: Medicaid Other | Attending: Emergency Medicine | Admitting: Emergency Medicine

## 2021-01-26 DIAGNOSIS — H53149 Visual discomfort, unspecified: Secondary | ICD-10-CM | POA: Insufficient documentation

## 2021-01-26 DIAGNOSIS — F17210 Nicotine dependence, cigarettes, uncomplicated: Secondary | ICD-10-CM | POA: Insufficient documentation

## 2021-01-26 DIAGNOSIS — R519 Headache, unspecified: Secondary | ICD-10-CM | POA: Insufficient documentation

## 2021-01-26 MED ORDER — METOCLOPRAMIDE HCL 5 MG/ML IJ SOLN
10.0000 mg | Freq: Once | INTRAMUSCULAR | Status: AC
  Administered 2021-01-26: 10 mg via INTRAVENOUS
  Filled 2021-01-26: qty 2

## 2021-01-26 MED ORDER — KETOROLAC TROMETHAMINE 15 MG/ML IJ SOLN
15.0000 mg | Freq: Once | INTRAMUSCULAR | Status: AC
  Administered 2021-01-26: 15 mg via INTRAVENOUS
  Filled 2021-01-26: qty 1

## 2021-01-26 MED ORDER — DIPHENHYDRAMINE HCL 50 MG/ML IJ SOLN
50.0000 mg | Freq: Once | INTRAMUSCULAR | Status: AC
  Administered 2021-01-26: 50 mg via INTRAVENOUS
  Filled 2021-01-26: qty 1

## 2021-01-26 MED ORDER — SODIUM CHLORIDE 0.9 % IV BOLUS
1000.0000 mL | Freq: Once | INTRAVENOUS | Status: AC
  Administered 2021-01-26: 1000 mL via INTRAVENOUS

## 2021-01-26 NOTE — Discharge Instructions (Addendum)
Please follow up with your PCP as scheduled for tomorrow.

## 2021-01-26 NOTE — ED Triage Notes (Signed)
Patient c/o headache x3 hours. Seen for same recently. Reports taking tylenol and ibuprofen PTA.

## 2021-01-26 NOTE — ED Provider Notes (Signed)
[facility]-EMERGENCY DEPT Provider Note   CSN: [id number] Arrival date & time: 01/26/21  1348     History Chief Complaint  Patient presents with   Headache    Matthew Kaiser is a 30 y.o. male who presents to the ED today with complaint of ongoing headaches for the past 11 days.  Patient was initially seen in the ED on 07/20 headache that began 5 hours ago.  He was also complaining of photophobia and phonophobia.  Had reassuring work-up including T head as well as MRI brain/MRA without significant findings.  He was treated with a headache cocktail with improvement in his symptoms and discharged home.  She returned to the ED on 7/25 for recurrent headache.  He was provided with subcue sumatriptan and 100% O2 nasal cannula with improvement in symptoms and discharged home.  He was initially discharged with amitriptyline nasal spray however he states he could not afford it.  The prescription was changed to oral sumatriptan.  He states that he took it as prescribed over the past couple of days with some improvement however he ran out of his medication yesterday.  He states that the headache returned prompting return ED visit today.  He does not have a PCP currently to follow-up with.  Continues to complain of headache, photophobia.  He states that if he lays in a darkened room it seems to help however otherwise his headache is severe.  He denies any vomiting.   Chart review patient was diagnosed with high blood pressure during initial ER visit on the 20th.  He was started on amlodipine.  He usually takes it around 7 PM, has not taken it yet.  The history is provided by the patient and medical records.      History reviewed. No pertinent past medical history.  There are no problems to display for this patient.   History reviewed. No pertinent surgical history.     No family history on file.  Social History   Tobacco Use   Smoking status: Every Day    Packs/day: 0.50     Types: Cigarettes   Smokeless tobacco: Current  Substance Use Topics   Alcohol use: Yes    Comment: socially   Drug use: Yes    Types: Marijuana    Home Medications Prior to Admission medications   Medication Sig Start Date End Date Taking? Authorizing Provider  amLODipine (NORVASC) 5 MG tablet Take 1 tablet (5 mg total) by mouth daily. 01/15/21 02/14/21  Claude Manges, PA-C  SUMAtriptan (IMITREX) 20 MG/ACT nasal spray Spray 1 spray in 1 nostril as needed for cluster headache.  May repeat in 2 hours if needed.  Do not use more than 2 sprays in a 24-hour period. 01/20/21   Molpus, John, MD  SUMAtriptan (IMITREX) 25 MG tablet Take 1 tablet by mouth as needed for headache. May repeat after 2 hours if needed * max 2 per 24 hours 01/22/21   Molpus, Jonny Ruiz, MD    Allergies    Patient has no known allergies.  Review of Systems   Review of Systems  Constitutional:  Negative for chills and fever.  Eyes:  Positive for photophobia. Negative for visual disturbance.  Gastrointestinal:  Negative for nausea and vomiting.  Neurological:  Positive for headaches. Negative for weakness.  All other systems reviewed and are negative.  Physical Exam Updated Vital Signs BP (!) 148/94   Pulse 65   Temp 97.7 F (36.5 C) (Oral)   Resp 20  SpO2 96%   Physical Exam Vitals and nursing note reviewed.  Constitutional:      Appearance: He is not ill-appearing or diaphoretic.     Comments: Laying in darkened room covering eyes with his arm  HENT:     Head: Normocephalic and atraumatic.  Eyes:     Extraocular Movements: Extraocular movements intact.     Conjunctiva/sclera: Conjunctivae normal.     Pupils: Pupils are equal, round, and reactive to light.  Cardiovascular:     Rate and Rhythm: Normal rate and regular rhythm.     Heart sounds: Normal heart sounds.  Pulmonary:     Effort: Pulmonary effort is normal.     Breath sounds: Normal breath sounds. No wheezing, rhonchi or rales.  Abdominal:      Palpations: Abdomen is soft.     Tenderness: There is no abdominal tenderness.  Musculoskeletal:     Cervical back: Neck supple.  Skin:    General: Skin is warm and dry.  Neurological:     Mental Status: He is alert and oriented to person, place, and time.     GCS: GCS eye subscore is 4. GCS verbal subscore is 5. GCS motor subscore is 6.     Cranial Nerves: No cranial nerve deficit.    ED Results / Procedures / Treatments   Labs (all labs ordered are listed, but only abnormal results are displayed) Labs Reviewed - No data to display  EKG None  Radiology No results found.  Procedures Procedures   Medications Ordered in ED Medications  sodium chloride 0.9 % bolus 1,000 mL (1,000 mLs Intravenous New Bag/Given 01/26/21 1538)  ketorolac (TORADOL) 15 MG/ML injection 15 mg (15 mg Intravenous Given 01/26/21 1538)  metoCLOPramide (REGLAN) injection 10 mg (10 mg Intravenous Given 01/26/21 1538)  diphenhydrAMINE (BENADRYL) injection 50 mg (50 mg Intravenous Given 01/26/21 1538)    ED Course  I have reviewed the triage vital signs and the nursing notes.  Pertinent labs & imaging results that were available during my care of the patient were reviewed by me and considered in my medical decision making (see chart for details).    MDM Rules/Calculators/A&P                           30 year old male who presents to the ED today with recurrent headache for the past 11 days.  Initial to ER visits, treated with headache cocktail and then subcu sumatriptan and 100% nasal cannula oxygen.  Reports recurrent headache at this time prompting ED visit.  Does not have PCP to follow-up with.  On arrival to the ED today patient is afebrile, nontachycardic and nontachypneic.  Blood pressure is elevated at 149/108 with history of same.  Recently started on amlodipine 11 days ago during initial ER visit.  On my exam he is lying in a darkened room covering his eyes with his arm is a report significant  photophobia.  He continues to have no focal neurodeficits on exam.  He did have a negative CT head as well as a negative MRI/MRA head during first ER visit.  Patient will ultimately need to follow-up with PCP or neurology for further evaluation.  Will provide headache cocktail fluids and reevaluate.  Pt reevaluated after headache cocktail; reports improvement in symptoms. He states he has  PCP follow up tomorrow. Stable for discharge at this time.   This note was prepared using Conservation officer, historic buildings and may include unintentional dictation  errors due to the inherent limitations of voice recognition software.   Final Clinical Impression(s) / ED Diagnoses Final diagnoses:  Bad headache    Rx / DC Orders ED Discharge Orders     None        Discharge Instructions      Please follow up with your PCP as scheduled for tomorrow.        Tanda Rockers, PA-C 01/26/21 1756    Charlynne Pander, MD 01/26/21 [phone]

## 2021-01-26 NOTE — ED Provider Notes (Signed)
Emergency Medicine Provider Triage Evaluation Note  Matthew Kaiser , a 30 y.o. male  was evaluated in triage.  Pt complains of a headache.  Patient states he has been dealing with recurrent headaches for the past week.  States his symptoms happen daily and typically last for about 3 hours.  He has been seen twice in the last 11 days for this in the emergency department.  At his most recent visit he was diagnosed with likely episodic cluster headaches and noted significant improvement in his symptoms with oxygen administration via nasal cannula as well as sumatriptan hand.  Reports associated photophobia.  Denies numbness, weakness, nausea, vomiting, fevers, URI symptoms.  Physical Exam  BP (!) 149/108 (BP Location: Right Arm)   Pulse 88   Temp 99.2 F (37.3 C) (Oral)   Resp 20   SpO2 99%  Gen:   Awake, no distress   Resp:  Normal effort  MSK:   Moves extremities without difficulty  Other:    Medical Decision Making  Medically screening exam initiated at 2:34 PM.  Appropriate orders placed.  Matthew Kaiser was informed that the remainder of the evaluation will be completed by another provider, this initial triage assessment does not replace that evaluation, and the importance of remaining in the ED until their evaluation is complete.   Matthew Sou, PA-C 01/26/21 1436    Matthew Bucco, MD 01/26/21 1536

## 2021-06-23 ENCOUNTER — Emergency Department (HOSPITAL_COMMUNITY): Payer: Self-pay | Admitting: Anesthesiology

## 2021-06-23 ENCOUNTER — Encounter (HOSPITAL_COMMUNITY)
Admission: EM | Disposition: A | Discharge status: Home / Self Care | Payer: Self-pay | Source: Home / Self Care | Attending: Student

## 2021-06-23 ENCOUNTER — Emergency Department (HOSPITAL_COMMUNITY): Payer: Self-pay

## 2021-06-23 ENCOUNTER — Inpatient Hospital Stay (HOSPITAL_COMMUNITY)
Admission: EM | Admit: 2021-06-23 | Discharge: 2021-06-24 | DRG: 482 | Disposition: A | Discharge status: Home / Self Care | Payer: Self-pay | Attending: Student | Admitting: Student

## 2021-06-23 ENCOUNTER — Inpatient Hospital Stay (HOSPITAL_COMMUNITY): Payer: Self-pay

## 2021-06-23 ENCOUNTER — Encounter (HOSPITAL_COMMUNITY): Payer: Self-pay | Admitting: Radiology

## 2021-06-23 DIAGNOSIS — S72302A Unspecified fracture of shaft of left femur, initial encounter for closed fracture: Secondary | ICD-10-CM | POA: Diagnosis present

## 2021-06-23 DIAGNOSIS — Y92009 Unspecified place in unspecified non-institutional (private) residence as the place of occurrence of the external cause: Secondary | ICD-10-CM

## 2021-06-23 DIAGNOSIS — W3400XA Accidental discharge from unspecified firearms or gun, initial encounter: Secondary | ICD-10-CM

## 2021-06-23 DIAGNOSIS — Z20822 Contact with and (suspected) exposure to covid-19: Secondary | ICD-10-CM | POA: Diagnosis present

## 2021-06-23 DIAGNOSIS — I10 Essential (primary) hypertension: Secondary | ICD-10-CM | POA: Diagnosis present

## 2021-06-23 DIAGNOSIS — Y249XXA Unspecified firearm discharge, undetermined intent, initial encounter: Secondary | ICD-10-CM

## 2021-06-23 DIAGNOSIS — Z419 Encounter for procedure for purposes other than remedying health state, unspecified: Secondary | ICD-10-CM

## 2021-06-23 DIAGNOSIS — Z23 Encounter for immunization: Secondary | ICD-10-CM

## 2021-06-23 DIAGNOSIS — F17210 Nicotine dependence, cigarettes, uncomplicated: Secondary | ICD-10-CM | POA: Diagnosis present

## 2021-06-23 DIAGNOSIS — T148XXA Other injury of unspecified body region, initial encounter: Secondary | ICD-10-CM

## 2021-06-23 DIAGNOSIS — Z79899 Other long term (current) drug therapy: Secondary | ICD-10-CM

## 2021-06-23 DIAGNOSIS — S7292XB Unspecified fracture of left femur, initial encounter for open fracture type I or II: Secondary | ICD-10-CM

## 2021-06-23 DIAGNOSIS — S72302B Unspecified fracture of shaft of left femur, initial encounter for open fracture type I or II: Principal | ICD-10-CM | POA: Diagnosis present

## 2021-06-23 HISTORY — PX: FEMUR IM NAIL: SHX1597

## 2021-06-23 HISTORY — DX: Essential (primary) hypertension: I10

## 2021-06-23 LAB — VITAMIN D 25 HYDROXY (VIT D DEFICIENCY, FRACTURES): Vit D, 25-Hydroxy: 17.42 ng/mL — ABNORMAL LOW (ref 30–100)

## 2021-06-23 LAB — COMPREHENSIVE METABOLIC PANEL
ALT: 25 U/L (ref 0–44)
AST: 25 U/L (ref 15–41)
Albumin: 4.1 g/dL (ref 3.5–5.0)
Alkaline Phosphatase: 78 U/L (ref 38–126)
Anion gap: 13 (ref 5–15)
BUN: 14 mg/dL (ref 6–20)
CO2: 20 mmol/L — ABNORMAL LOW (ref 22–32)
Calcium: 8.6 mg/dL — ABNORMAL LOW (ref 8.9–10.3)
Chloride: 103 mmol/L (ref 98–111)
Creatinine, Ser: 0.9 mg/dL (ref 0.61–1.24)
GFR, Estimated: >60 mL/min (ref >60)
Glucose, Bld: 160 mg/dL — ABNORMAL HIGH (ref 70–99)
Potassium: 3.7 mmol/L (ref 3.5–5.1)
Sodium: 136 mmol/L (ref 135–145)
Total Bilirubin: 1.6 mg/dL — ABNORMAL HIGH (ref 0.3–1.2)
Total Protein: 6.9 g/dL (ref 6.5–8.1)

## 2021-06-23 LAB — PROTIME-INR
INR: 1 (ref 0.8–1.2)
Prothrombin Time: 12.8 seconds (ref 11.4–15.2)

## 2021-06-23 LAB — RESP PANEL BY RT-PCR (FLU A&B, COVID) ARPGX2
Influenza A by PCR: NEGATIVE
Influenza B by PCR: NEGATIVE
SARS Coronavirus 2 by RT PCR: NEGATIVE

## 2021-06-23 LAB — CBC
HCT: 42.5 % (ref 39.0–52.0)
Hemoglobin: 15.7 g/dL (ref 13.0–17.0)
MCH: 31.8 pg (ref 26.0–34.0)
MCHC: 36.9 g/dL — ABNORMAL HIGH (ref 30.0–36.0)
MCV: 86 fL (ref 80.0–100.0)
Platelets: 314 10*3/uL (ref 150–400)
RBC: 4.94 MIL/uL (ref 4.22–5.81)
RDW: 13.9 % (ref 11.5–15.5)
WBC: 17.4 10*3/uL — ABNORMAL HIGH (ref 4.0–10.5)
nRBC: 0 % (ref 0.0–0.2)

## 2021-06-23 LAB — SAMPLE TO BLOOD BANK

## 2021-06-23 LAB — I-STAT CHEM 8, ED
BUN: 17 mg/dL (ref 6–20)
Calcium, Ion: 1.02 mmol/L — ABNORMAL LOW (ref 1.15–1.40)
Chloride: 106 mmol/L (ref 98–111)
Creatinine, Ser: 0.8 mg/dL (ref 0.61–1.24)
Glucose, Bld: 154 mg/dL — ABNORMAL HIGH (ref 70–99)
HCT: 48 % (ref 39.0–52.0)
Hemoglobin: 16.3 g/dL (ref 13.0–17.0)
Potassium: 3.5 mmol/L (ref 3.5–5.1)
Sodium: 138 mmol/L (ref 135–145)
TCO2: 24 mmol/L (ref 22–32)

## 2021-06-23 LAB — ETHANOL: Alcohol, Ethyl (B): <10 mg/dL (ref <10)

## 2021-06-23 LAB — CREATININE, SERUM
Creatinine, Ser: 0.8 mg/dL (ref 0.61–1.24)
GFR, Estimated: >60 mL/min (ref >60)

## 2021-06-23 LAB — LACTIC ACID, PLASMA: Lactic Acid, Venous: 1.1 mmol/L (ref 0.5–1.9)
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
## 2021-06-23 SURGERY — INSERTION, INTRAMEDULLARY ROD, FEMUR, RETROGRADE
Anesthesia: General | Site: Leg Upper | Laterality: Left

## 2021-06-23 MED ORDER — CEFAZOLIN SODIUM-DEXTROSE 2-4 GM/100ML-% IV SOLN
2.0000 g | Freq: Three times a day (TID) | INTRAVENOUS | Status: AC
  Administered 2021-06-23 – 2021-06-24 (×3): 2 g via INTRAVENOUS
  Filled 2021-06-23 (×5): qty 100

## 2021-06-23 MED ORDER — HYDROMORPHONE HCL 1 MG/ML IJ SOLN
INTRAMUSCULAR | Status: DC | PRN
  Administered 2021-06-23: .5 mg via INTRAVENOUS

## 2021-06-23 MED ORDER — MIDAZOLAM HCL 2 MG/2ML IJ SOLN
INTRAMUSCULAR | Status: AC
  Filled 2021-06-23: qty 2

## 2021-06-23 MED ORDER — CELECOXIB 200 MG PO CAPS
200.0000 mg | ORAL_CAPSULE | Freq: Two times a day (BID) | ORAL | Status: DC
  Administered 2021-06-23 – 2021-06-24 (×2): 200 mg via ORAL
  Filled 2021-06-23 (×3): qty 1

## 2021-06-23 MED ORDER — HYDRALAZINE HCL 10 MG PO TABS: 10.0000 mg | ORAL_TABLET | Freq: Four times a day (QID) | ORAL | Status: DC | PRN

## 2021-06-23 MED ORDER — SUGAMMADEX SODIUM 200 MG/2ML IV SOLN
INTRAVENOUS | Status: DC | PRN
  Administered 2021-06-23: 400 mg via INTRAVENOUS

## 2021-06-23 MED ORDER — HYDROMORPHONE HCL 1 MG/ML IJ SOLN: 1.0000 mg | INTRAMUSCULAR | Status: DC | PRN

## 2021-06-23 MED ORDER — VANCOMYCIN HCL 1000 MG IV SOLR
INTRAVENOUS | Status: DC | PRN
  Administered 2021-06-23: 1000 mg

## 2021-06-23 MED ORDER — KETAMINE HCL 50 MG/5ML IJ SOSY
PREFILLED_SYRINGE | INTRAMUSCULAR | Status: AC
  Filled 2021-06-23: qty 5

## 2021-06-23 MED ORDER — ONDANSETRON HCL 4 MG PO TABS: 4.0000 mg | ORAL_TABLET | Freq: Four times a day (QID) | ORAL | Status: DC | PRN

## 2021-06-23 MED ORDER — ONDANSETRON HCL 4 MG/2ML IJ SOLN: 4.0000 mg | Freq: Four times a day (QID) | INTRAMUSCULAR | Status: DC | PRN

## 2021-06-23 MED ORDER — SUCCINYLCHOLINE CHLORIDE 200 MG/10ML IV SOSY
PREFILLED_SYRINGE | INTRAVENOUS | Status: DC | PRN
  Administered 2021-06-23: 120 mg via INTRAVENOUS

## 2021-06-23 MED ORDER — ONDANSETRON HCL 4 MG/2ML IJ SOLN
INTRAMUSCULAR | Status: DC | PRN
  Administered 2021-06-23: 4 mg via INTRAVENOUS

## 2021-06-23 MED ORDER — DEXMEDETOMIDINE (PRECEDEX) IN NS 20 MCG/5ML (4 MCG/ML) IV SYRINGE
PREFILLED_SYRINGE | INTRAVENOUS | Status: DC | PRN
  Administered 2021-06-23: 12 ug via INTRAVENOUS
  Administered 2021-06-23: 8 ug via INTRAVENOUS

## 2021-06-23 MED ORDER — MORPHINE SULFATE (PF) 4 MG/ML IV SOLN
4.0000 mg | Freq: Once | INTRAVENOUS | Status: AC
  Administered 2021-06-23: 10:00:00 4 mg via INTRAVENOUS
  Filled 2021-06-23: qty 1

## 2021-06-23 MED ORDER — ENOXAPARIN SODIUM 40 MG/0.4ML IJ SOSY
40.0000 mg | PREFILLED_SYRINGE | INTRAMUSCULAR | Status: DC
  Administered 2021-06-24: 09:00:00 40 mg via SUBCUTANEOUS
  Filled 2021-06-23: qty 0.4

## 2021-06-23 MED ORDER — ACETAMINOPHEN 10 MG/ML IV SOLN
INTRAVENOUS | Status: DC | PRN
  Administered 2021-06-23: 1000 mg via INTRAVENOUS

## 2021-06-23 MED ORDER — PROPOFOL 10 MG/ML IV BOLUS
INTRAVENOUS | Status: AC
  Filled 2021-06-23: qty 20

## 2021-06-23 MED ORDER — BISACODYL 10 MG RE SUPP: 10.0000 mg | Freq: Every day | RECTAL | Status: DC | PRN

## 2021-06-23 MED ORDER — CEFAZOLIN SODIUM-DEXTROSE 2-4 GM/100ML-% IV SOLN
2.0000 g | Freq: Once | INTRAVENOUS | Status: AC
  Administered 2021-06-23: 09:00:00 2 g via INTRAVENOUS

## 2021-06-23 MED ORDER — OXYCODONE HCL 5 MG PO TABS
10.0000 mg | ORAL_TABLET | ORAL | Status: DC | PRN
  Administered 2021-06-24: 06:00:00 10 mg via ORAL
  Filled 2021-06-23: qty 2

## 2021-06-23 MED ORDER — KETAMINE HCL 10 MG/ML IJ SOLN
INTRAMUSCULAR | Status: DC | PRN
  Administered 2021-06-23 (×2): 20 mg via INTRAVENOUS

## 2021-06-23 MED ORDER — FENTANYL CITRATE (PF) 100 MCG/2ML IJ SOLN
INTRAMUSCULAR | Status: DC | PRN
  Administered 2021-06-23: 50 ug via INTRAVENOUS
  Administered 2021-06-23: 100 ug via INTRAVENOUS
  Administered 2021-06-23 (×2): 50 ug via INTRAVENOUS

## 2021-06-23 MED ORDER — CHLORHEXIDINE GLUCONATE 0.12 % MT SOLN
15.0000 mL | Freq: Once | OROMUCOSAL | Status: AC
  Filled 2021-06-23: qty 15

## 2021-06-23 MED ORDER — ORAL CARE MOUTH RINSE: 15.0000 mL | Freq: Once | OROMUCOSAL | Status: AC

## 2021-06-23 MED ORDER — HYDROMORPHONE HCL 1 MG/ML IJ SOLN
INTRAMUSCULAR | Status: AC
  Filled 2021-06-23: qty 1

## 2021-06-23 MED ORDER — METHOCARBAMOL 500 MG PO TABS: 500.0000 mg | ORAL_TABLET | Freq: Four times a day (QID) | ORAL | Status: DC | PRN

## 2021-06-23 MED ORDER — IOHEXOL 350 MG/ML SOLN
100.0000 mL | Freq: Once | INTRAVENOUS | Status: AC | PRN
  Administered 2021-06-23: 10:00:00 100 mL via INTRAVENOUS

## 2021-06-23 MED ORDER — DIPHENHYDRAMINE HCL 12.5 MG/5ML PO ELIX: 12.5000 mg | ORAL_SOLUTION | ORAL | Status: DC | PRN

## 2021-06-23 MED ORDER — DOCUSATE SODIUM 100 MG PO CAPS
100.0000 mg | ORAL_CAPSULE | Freq: Two times a day (BID) | ORAL | Status: DC
  Administered 2021-06-23 – 2021-06-24 (×2): 100 mg via ORAL
  Filled 2021-06-23 (×2): qty 1

## 2021-06-23 MED ORDER — TOPIRAMATE 25 MG PO TABS
50.0000 mg | ORAL_TABLET | Freq: Every day | ORAL | Status: DC
  Administered 2021-06-23: 22:00:00 50 mg via ORAL
  Filled 2021-06-23 (×2): qty 2

## 2021-06-23 MED ORDER — SODIUM CHLORIDE 0.9 % IV SOLN: INTRAVENOUS | Status: DC

## 2021-06-23 MED ORDER — HYDROMORPHONE HCL 1 MG/ML IJ SOLN
0.5000 mg | INTRAMUSCULAR | Status: DC | PRN
  Administered 2021-06-23 – 2021-06-24 (×2): 1 mg via INTRAVENOUS
  Filled 2021-06-23 (×2): qty 1

## 2021-06-23 MED ORDER — METOCLOPRAMIDE HCL 5 MG PO TABS: 5.0000 mg | ORAL_TABLET | Freq: Three times a day (TID) | ORAL | Status: DC | PRN

## 2021-06-23 MED ORDER — HYDROMORPHONE HCL 1 MG/ML IJ SOLN
1.0000 mg | Freq: Once | INTRAMUSCULAR | Status: AC
  Administered 2021-06-23: 11:00:00 1 mg via INTRAVENOUS
  Filled 2021-06-23: qty 1

## 2021-06-23 MED ORDER — HYDROMORPHONE HCL 1 MG/ML IJ SOLN
1.0000 mg | Freq: Once | INTRAMUSCULAR | Status: AC
  Administered 2021-06-23: 09:00:00 1 mg via INTRAVENOUS

## 2021-06-23 MED ORDER — SUMATRIPTAN SUCCINATE 25 MG PO TABS
25.0000 mg | ORAL_TABLET | ORAL | Status: DC | PRN
  Filled 2021-06-23: qty 1

## 2021-06-23 MED ORDER — HYDROMORPHONE HCL 1 MG/ML IJ SOLN
INTRAMUSCULAR | Status: AC
  Filled 2021-06-23: qty 0.5

## 2021-06-23 MED ORDER — ROCURONIUM BROMIDE 100 MG/10ML IV SOLN
INTRAVENOUS | Status: DC | PRN
  Administered 2021-06-23: 50 mg via INTRAVENOUS

## 2021-06-23 MED ORDER — OXYCODONE HCL 5 MG PO TABS
5.0000 mg | ORAL_TABLET | ORAL | Status: DC | PRN
  Administered 2021-06-23: 18:00:00 10 mg via ORAL
  Filled 2021-06-23: qty 2

## 2021-06-23 MED ORDER — LACTATED RINGERS IV SOLN: INTRAVENOUS | Status: DC | PRN

## 2021-06-23 MED ORDER — METOCLOPRAMIDE HCL 5 MG/ML IJ SOLN: 5.0000 mg | Freq: Three times a day (TID) | INTRAMUSCULAR | Status: DC | PRN

## 2021-06-23 MED ORDER — PROPOFOL 10 MG/ML IV BOLUS
INTRAVENOUS | Status: DC | PRN
  Administered 2021-06-23: 100 mg via INTRAVENOUS
  Administered 2021-06-23: 200 mg via INTRAVENOUS

## 2021-06-23 MED ORDER — CEFAZOLIN SODIUM-DEXTROSE 1-4 GM/50ML-% IV SOLN
INTRAVENOUS | Status: DC | PRN
  Administered 2021-06-23: 1 g via INTRAVENOUS

## 2021-06-23 MED ORDER — PROMETHAZINE HCL 25 MG/ML IJ SOLN: 6.2500 mg | INTRAMUSCULAR | Status: DC | PRN

## 2021-06-23 MED ORDER — METHOCARBAMOL 1000 MG/10ML IJ SOLN
500.0000 mg | Freq: Four times a day (QID) | INTRAVENOUS | Status: DC | PRN
  Filled 2021-06-23: qty 5

## 2021-06-23 MED ORDER — MIDAZOLAM HCL 2 MG/2ML IJ SOLN
INTRAMUSCULAR | Status: DC | PRN
  Administered 2021-06-23: 2 mg via INTRAVENOUS

## 2021-06-23 MED ORDER — VANCOMYCIN HCL 1000 MG IV SOLR
INTRAVENOUS | Status: AC
  Filled 2021-06-23: qty 20

## 2021-06-23 MED ORDER — FENTANYL CITRATE (PF) 250 MCG/5ML IJ SOLN
INTRAMUSCULAR | Status: AC
  Filled 2021-06-23: qty 5

## 2021-06-23 MED ORDER — VITAMIN D 25 MCG (1000 UNIT) PO TABS
2000.0000 [IU] | ORAL_TABLET | Freq: Every day | ORAL | Status: DC
  Administered 2021-06-23 – 2021-06-24 (×2): 2000 [IU] via ORAL
  Filled 2021-06-23 (×2): qty 2

## 2021-06-23 MED ORDER — CHLORHEXIDINE GLUCONATE 0.12 % MT SOLN
OROMUCOSAL | Status: AC
  Administered 2021-06-23: 12:00:00 15 mL via OROMUCOSAL
  Filled 2021-06-23: qty 15

## 2021-06-23 MED ORDER — 0.9 % SODIUM CHLORIDE (POUR BTL) OPTIME
TOPICAL | Status: DC | PRN
  Administered 2021-06-23: 12:00:00 1000 mL

## 2021-06-23 MED ORDER — ACETAMINOPHEN 325 MG PO TABS: 325.0000 mg | ORAL_TABLET | Freq: Four times a day (QID) | ORAL | Status: DC | PRN

## 2021-06-23 MED ORDER — LIDOCAINE HCL (CARDIAC) PF 100 MG/5ML IV SOSY
PREFILLED_SYRINGE | INTRAVENOUS | Status: DC | PRN
  Administered 2021-06-23: 100 mg via INTRAVENOUS

## 2021-06-23 MED ORDER — LACTATED RINGERS IV SOLN: INTRAVENOUS | Status: DC

## 2021-06-23 MED ORDER — AMLODIPINE BESYLATE 5 MG PO TABS
5.0000 mg | ORAL_TABLET | Freq: Every day | ORAL | Status: DC
  Administered 2021-06-23 – 2021-06-24 (×2): 5 mg via ORAL
  Filled 2021-06-23 (×2): qty 1

## 2021-06-23 MED ORDER — POLYETHYLENE GLYCOL 3350 17 G PO PACK: 17.0000 g | PACK | Freq: Every day | ORAL | Status: DC | PRN

## 2021-06-23 MED ORDER — TETANUS-DIPHTH-ACELL PERTUSSIS 5-2.5-18.5 LF-MCG/0.5 IM SUSY
0.5000 mL | PREFILLED_SYRINGE | Freq: Once | INTRAMUSCULAR | Status: AC
  Administered 2021-06-23: 10:00:00 0.5 mL via INTRAMUSCULAR

## 2021-06-23 MED ORDER — HYDROMORPHONE HCL 1 MG/ML IJ SOLN
0.2500 mg | INTRAMUSCULAR | Status: DC | PRN
  Administered 2021-06-23 (×2): 0.25 mg via INTRAVENOUS
  Administered 2021-06-23: 16:00:00 0.5 mg via INTRAVENOUS

## 2021-06-23 SURGICAL SUPPLY — 68 items
BAG COUNTER SPONGE SURGICOUNT (BAG) ×2 IMPLANT
BAG SURGICOUNT SPONGE COUNTING (BAG) ×1
BIT DRILL CALIBRATED 4.2 (BIT) IMPLANT
BIT DRILL CANN QC 11.2 LG (BIT) ×2 IMPLANT
BIT DRILL SHORT 4.2 (BIT) IMPLANT
BNDG COHESIVE 4X5 TAN STRL (GAUZE/BANDAGES/DRESSINGS) ×3 IMPLANT
BNDG ELASTIC 4X5.8 VLCR STR LF (GAUZE/BANDAGES/DRESSINGS) ×3 IMPLANT
BNDG ELASTIC 6X10 VLCR STRL LF (GAUZE/BANDAGES/DRESSINGS) ×2 IMPLANT
BNDG ELASTIC 6X5.8 VLCR STR LF (GAUZE/BANDAGES/DRESSINGS) ×3 IMPLANT
BRUSH SCRUB EZ PLAIN DRY (MISCELLANEOUS) ×6 IMPLANT
CHLORAPREP W/TINT 26 (MISCELLANEOUS) ×3 IMPLANT
COVER MAYO STAND STRL (DRAPES) ×3 IMPLANT
COVER SURGICAL LIGHT HANDLE (MISCELLANEOUS) ×6 IMPLANT
DERMABOND ADHESIVE PROPEN (GAUZE/BANDAGES/DRESSINGS) ×4
DERMABOND ADVANCED (GAUZE/BANDAGES/DRESSINGS) ×2
DERMABOND ADVANCED .7 DNX12 (GAUZE/BANDAGES/DRESSINGS) ×1 IMPLANT
DERMABOND ADVANCED .7 DNX6 (GAUZE/BANDAGES/DRESSINGS) IMPLANT
DRAPE C-ARM 42X72 X-RAY (DRAPES) ×3 IMPLANT
DRAPE C-ARMOR (DRAPES) ×3 IMPLANT
DRAPE HALF SHEET 40X57 (DRAPES) ×6 IMPLANT
DRAPE IMP U-DRAPE 54X76 (DRAPES) ×6 IMPLANT
DRAPE INCISE IOBAN 66X45 STRL (DRAPES) ×3 IMPLANT
DRAPE ORTHO SPLIT 77X108 STRL (DRAPES) ×6
DRAPE SURG 17X23 STRL (DRAPES) ×3 IMPLANT
DRAPE SURG ORHT 6 SPLT 77X108 (DRAPES) ×2 IMPLANT
DRAPE U-SHAPE 47X51 STRL (DRAPES) ×3 IMPLANT
DRESSING MEPILEX FLEX 4X4 (GAUZE/BANDAGES/DRESSINGS) IMPLANT
DRILL BIT CALIBRATED 4.2 (BIT) ×3
DRILL BIT SHORT 4.2 (BIT) ×6
DRSG MEPILEX FLEX 4X4 (GAUZE/BANDAGES/DRESSINGS) ×12
ELECT REM PT RETURN 9FT ADLT (ELECTROSURGICAL) ×3
ELECTRODE REM PT RTRN 9FT ADLT (ELECTROSURGICAL) ×1 IMPLANT
GAUZE SPONGE 4X4 12PLY STRL (GAUZE/BANDAGES/DRESSINGS) ×3 IMPLANT
GLOVE SURG ENC MOIS LTX SZ6.5 (GLOVE) ×9 IMPLANT
GLOVE SURG ENC MOIS LTX SZ7.5 (GLOVE) ×12 IMPLANT
GLOVE SURG UNDER POLY LF SZ6.5 (GLOVE) ×3 IMPLANT
GLOVE SURG UNDER POLY LF SZ7.5 (GLOVE) ×3 IMPLANT
GOWN STRL REUS W/ TWL LRG LVL3 (GOWN DISPOSABLE) ×2 IMPLANT
GOWN STRL REUS W/TWL LRG LVL3 (GOWN DISPOSABLE) ×6
GUIDEWIRE 3.2X400 (WIRE) ×2 IMPLANT
KIT BASIN OR (CUSTOM PROCEDURE TRAY) ×3 IMPLANT
KIT TURNOVER KIT B (KITS) ×3 IMPLANT
NAIL CANN RFNA 10X420 5D (Nail) ×2 IMPLANT
PACK ORTHO EXTREMITY (CUSTOM PROCEDURE TRAY) ×3 IMPLANT
PAD ARMBOARD 7.5X6 YLW CONV (MISCELLANEOUS) ×6 IMPLANT
PAD CAST 3X4 CTTN HI CHSV (CAST SUPPLIES) IMPLANT
PADDING CAST COTTON 3X4 STRL (CAST SUPPLIES) ×3
PADDING CAST SYNTHETIC 4 (CAST SUPPLIES) ×2
PADDING CAST SYNTHETIC 4X4 STR (CAST SUPPLIES) IMPLANT
REAMER ROD 3.8 BALL TIP 3X950 (ORTHOPEDIC DISPOSABLE SUPPLIES) ×2 IMPLANT
SCREW LOCK HDLS 5X84 (Screw) ×3 IMPLANT
SCREW LOCK HDLS 5X90 (Screw) ×2 IMPLANT
SCREW LOCK IM 5X38X125 (Screw) ×2 IMPLANT
SCREW LOCK IM TI 5X42 STRL (Screw) ×2 IMPLANT
SCREW LOCK LP HDLS 5X68 (Screw) ×2 IMPLANT
SCREW LOCK X25 84X5X LOPRO (Screw) IMPLANT
SPONGE T-LAP 18X18 ~~LOC~~+RFID (SPONGE) ×7 IMPLANT
STAPLER VISISTAT 35W (STAPLE) ×3 IMPLANT
SUT MNCRL AB 3-0 PS2 18 (SUTURE) ×3 IMPLANT
SUT VIC AB 0 CT1 27 (SUTURE)
SUT VIC AB 0 CT1 27XBRD ANBCTR (SUTURE) IMPLANT
SUT VIC AB 2-0 CT1 27 (SUTURE)
SUT VIC AB 2-0 CT1 TAPERPNT 27 (SUTURE) IMPLANT
TOWEL GREEN STERILE (TOWEL DISPOSABLE) ×6 IMPLANT
TOWEL GREEN STERILE FF (TOWEL DISPOSABLE) ×3 IMPLANT
TUBE CONNECTING 12'X1/4 (SUCTIONS) ×1
TUBE CONNECTING 12X1/4 (SUCTIONS) ×2 IMPLANT
YANKAUER SUCT BULB TIP NO VENT (SUCTIONS) ×3 IMPLANT

## 2021-06-23 NOTE — ED Notes (Signed)
Pt family came soon after pt transported to OR. Advised family where she could wait. Clydie Braun RN was transporting pt and was notified family will be in OR waiting.
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
## 2021-06-23 NOTE — Anesthesia Preprocedure Evaluation (Addendum)
Anesthesia Evaluation  Patient identified by MRN, date of birth, ID band Patient awake    Reviewed: Allergy & Precautions, NPO status , Patient's Chart, lab work & pertinent test results  Airway Mallampati: II  TM Distance: >3 FB Neck ROM: Full    Dental  (+) Teeth Intact, Dental Advisory Given   Pulmonary Current Smoker and Patient abstained from smoking.,    Pulmonary exam normal breath sounds clear to auscultation       Cardiovascular hypertension, Normal cardiovascular exam Rhythm:Regular Rate:Normal     Neuro/Psych negative neurological ROS     GI/Hepatic negative GI ROS, (+)     substance abuse  marijuana use,   Endo/Other  negative endocrine ROS  Renal/GU negative Renal ROS     Musculoskeletal negative musculoskeletal ROS (+) Left femur fracture   Abdominal   Peds  Hematology negative hematology ROS (+)   Anesthesia Other Findings Day of surgery medications reviewed with the patient.  Reproductive/Obstetrics                            Anesthesia Physical Anesthesia Plan  ASA: 2  Anesthesia Plan: General   Post-op Pain Management: Ketamine IV and Ofirmev IV (intra-op)   Induction: Intravenous  PONV Risk Score and Plan: 2 and Midazolam, Dexamethasone and Ondansetron  Airway Management Planned: Oral ETT  Additional Equipment:   Intra-op Plan:   Post-operative Plan: Extubation in OR  Informed Consent: I have reviewed the patients History and Physical, chart, labs and discussed the procedure including the risks, benefits and alternatives for the proposed anesthesia with the patient or authorized representative who has indicated his/her understanding and acceptance.     Dental advisory given  Plan Discussed with: CRNA  Anesthesia Plan Comments:         Anesthesia Quick Evaluation

## 2021-06-23 NOTE — ED Notes (Signed)
Left pedal pulse found by doppler method by PA- No pulse palpable in left foot  Anell Barr, RN
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
## 2021-06-23 NOTE — Transfer of Care (Signed)
Immediate Anesthesia Transfer of Care Note  Patient: Matthew Kaiser  Procedure(s) Performed: INTRAMEDULLARY (IM) RETROGRADE FEMORAL NAILING (Left: Leg Upper)  Patient Location: PACU  Anesthesia Type:General  Level of Consciousness: awake and drowsy  Airway & Oxygen Therapy: Patient Spontanous Breathing  Post-op Assessment: Report given to RN  Post vital signs: Reviewed  Last Vitals:  Vitals Value Taken Time  BP 137/94 06/23/21 1357  Temp    Pulse 103 06/23/21 1358  Resp 19 06/23/21 1358  SpO2 100 % 06/23/21 1358  Vitals shown include unvalidated device data.  Last Pain:  Vitals:   06/23/21 1151  TempSrc: Oral  PainSc: 7       Patients Stated Pain Goal: 2 (06/23/21 1151)  Complications: No notable events documented.
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
## 2021-06-23 NOTE — ED Notes (Signed)
Pt placed on hospital bed to allow for better comfort and option for traction placement.

## 2021-06-23 NOTE — ED Notes (Signed)
Pt transported to CT ?
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
## 2021-06-23 NOTE — Anesthesia Postprocedure Evaluation (Signed)
Anesthesia Post Note  Patient: Matthew Kaiser  Procedure(s) Performed: INTRAMEDULLARY (IM) RETROGRADE FEMORAL NAILING (Left: Leg Upper)     Patient location during evaluation: PACU Anesthesia Type: General Level of consciousness: awake and alert Pain management: pain level controlled Vital Signs Assessment: post-procedure vital signs reviewed and stable Respiratory status: spontaneous breathing, nonlabored ventilation and respiratory function stable Cardiovascular status: blood pressure returned to baseline and stable Postop Assessment: no apparent nausea or vomiting Anesthetic complications: no   No notable events documented.  Last Vitals:  Vitals:   06/23/21 1615 06/23/21 1645  BP: (!) 145/98 (!) 149/107  Pulse: 90 98  Resp: 15 17  Temp:  37.1 C  SpO2: 97% 99%    Last Pain:  Vitals:   06/23/21 1645  TempSrc: Oral  PainSc:                  [name]

## 2021-06-23 NOTE — ED Notes (Signed)
Hospital bed ordered for patient.

## 2021-06-23 NOTE — ED Notes (Signed)
Doppler pulse for left leg. Faint pulses audible by MD

## 2021-06-23 NOTE — Anesthesia Procedure Notes (Signed)
Procedure Name: Intubation Date/Time: 06/23/2021 12:34 PM Performed by: Angela Adam, CRNA Pre-anesthesia Checklist: Patient identified, Emergency Drugs available, Suction available, Patient being monitored and Timeout performed Patient Re-evaluated:Patient Re-evaluated prior to induction Oxygen Delivery Method: Circle system utilized Preoxygenation: Pre-oxygenation with 100% oxygen Induction Type: IV induction and Rapid sequence Laryngoscope Size: Glidescope and 4 Grade View: Grade I Tube type: Oral Tube size: 7.5 mm Number of attempts: 1 Airway Equipment and Method: Stylet Placement Confirmation: ETT inserted through vocal cords under direct vision, positive ETCO2 and breath sounds checked- equal and bilateral Secured at: 23 cm Tube secured with: Tape Dental Injury: Teeth and Oropharynx as per pre-operative assessment

## 2021-06-24 ENCOUNTER — Other Ambulatory Visit (HOSPITAL_COMMUNITY): Payer: Self-pay

## 2021-06-24 ENCOUNTER — Encounter (HOSPITAL_COMMUNITY): Payer: Self-pay | Admitting: Student

## 2021-06-24 LAB — CBC
HCT: 37.9 % — ABNORMAL LOW (ref 39.0–52.0)
Hemoglobin: 14 g/dL (ref 13.0–17.0)
MCH: 31.3 pg (ref 26.0–34.0)
MCHC: 36.9 g/dL — ABNORMAL HIGH (ref 30.0–36.0)
MCV: 84.8 fL (ref 80.0–100.0)
Platelets: 277 10*3/uL (ref 150–400)
RBC: 4.47 MIL/uL (ref 4.22–5.81)
RDW: 13.7 % (ref 11.5–15.5)
WBC: 13.7 10*3/uL — ABNORMAL HIGH (ref 4.0–10.5)
nRBC: 0 % (ref 0.0–0.2)

## 2021-06-24 MED ORDER — ASPIRIN 81 MG PO TBEC
81.0000 mg | DELAYED_RELEASE_TABLET | Freq: Two times a day (BID) | ORAL | 0 refills | Status: AC
  Filled 2021-06-24: qty 60, 30d supply, fill #0

## 2021-06-24 MED ORDER — VITAMIN D3 25 MCG PO TABS
2000.0000 [IU] | ORAL_TABLET | Freq: Every day | ORAL | 2 refills | Status: AC
Start: 2021-06-24 — End: 2021-09-22
  Filled 2021-06-24: qty 60, 30d supply, fill #0

## 2021-06-24 MED ORDER — OXYCODONE-ACETAMINOPHEN 5-325 MG PO TABS
1.0000 | ORAL_TABLET | ORAL | 0 refills | Status: DC | PRN
Start: 2021-06-24 — End: 2022-11-05
  Filled 2021-06-24: qty 42, 7d supply, fill #0

## 2021-06-24 MED ORDER — METHOCARBAMOL 500 MG PO TABS
500.0000 mg | ORAL_TABLET | Freq: Four times a day (QID) | ORAL | 0 refills | Status: DC | PRN
  Filled 2021-06-24: qty 28, 7d supply, fill #0

## 2021-06-24 MED ORDER — ACETAMINOPHEN 325 MG PO TABS
650.0000 mg | ORAL_TABLET | Freq: Four times a day (QID) | ORAL | Status: DC
  Administered 2021-06-24: 09:00:00 650 mg via ORAL
  Filled 2021-06-24: qty 2
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
## 2021-06-24 NOTE — Progress Notes (Signed)
Orthopedic Tech Progress Note Patient Details:  Matthew Kaiser 08/15/1990 [id number]  Ortho Devices Type of Ortho Device: Crutches Ortho Device/Splint Location: LLE Ortho Device/Splint Interventions: Ordered, Application, Adjustment   Post Interventions Patient Tolerated: Well, Ambulated well Instructions Provided: Poper ambulation with device  [name] 06/24/2021, 4:32 PM
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
## 2021-06-24 NOTE — TOC Transition Note (Addendum)
Transition of Care [facility]) - CM/SW Discharge Note   Patient Details  Name: Matthew Kaiser MRN: [id number] Date of Birth: Oct 31, 1990  [facility] of [facility]) CM/SW Contact:  Kingsley Plan, RN Phone Number: 06/24/2021, 10:34 AM   Clinical Narrative:      Transition of Care [facility]) Screening Note   Patient Details  Name: Matthew Kaiser Date of Birth: Sep 11, 1990   [facility] of [facility]) CM/SW Contact:    Kingsley Plan, RN Phone Number: 06/24/2021, 10:34 AM Spoke to patient at bedside. Patient from home. Lives with "someone". Patient does not have a PCP, placed [facility] information on AVS. Patient does not have insurance. PT to work with patient again. Crutches (pending trial and second session vs RW) .  Pharmacy changed to [facility]   [facility] information placed on AVS     Will continue to follow. If crutches needed nurse will order with ortho tech, if walker needed NCM will order with [employer].   PT secure chatted NCM for walker and crutches. NCM ordered walker with Velna Hatchet with [employer]. Patient has no insurance, [employer] will discuss cost directly with patient.   Bedside nurse orders crutches.        Patient Goals and CMS Choice        Discharge Placement                       Discharge Plan and Services                                     Social Determinants of Health (SDOH) Interventions     Readmission Risk Interventions No flowsheet data found.
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
## 2021-06-24 NOTE — Discharge Instructions (Signed)
[facility] Discharge Instructions   General Discharge Instructions  WEIGHT BEARING STATUS:Touchdown weightbearing left leg  RANGE OF MOTION/ACTIVITY: Ok for hip and knee motion as tolerated  Wound Care: You may remove all surgical dressings. Incisions can be left open to air if there is no drainage. If incision continues to have drainage, follow wound care instructions below. Okay to shower if no drainage from incisions.  DVT/PE prophylaxis: Aspirin 81 mg twice daily x 30 days  Diet: as you were eating previously.  Can use over the counter stool softeners and bowel preparations, such as Miralax, to help with bowel movements.  Narcotics can be constipating.  Be sure to drink plenty of fluids  PAIN MEDICATION USE AND EXPECTATIONS  You have likely been given narcotic medications to help control your pain.  After a traumatic event that results in an fracture (broken bone) with or without surgery, it is ok to use narcotic pain medications to help control one's pain.  We understand that everyone responds to pain differently and each individual patient will be evaluated on a regular basis for the continued need for narcotic medications. Ideally, narcotic medication use should last no more than 6-8 weeks (coinciding with fracture healing).   As a patient it is your responsibility as well to monitor narcotic medication use and report the amount and frequency you use these medications when you come to your office visit.   We would also advise that if you are using narcotic medications, you should take a dose prior to therapy to maximize you participation.  IF YOU ARE ON NARCOTIC MEDICATIONS IT IS NOT PERMISSIBLE TO OPERATE A MOTOR VEHICLE (MOTORCYCLE/CAR/TRUCK/MOPED) OR HEAVY MACHINERY DO NOT MIX NARCOTICS WITH OTHER CNS (CENTRAL NERVOUS SYSTEM) DEPRESSANTS SUCH AS ALCOHOL   STOP SMOKING OR USING NICOTINE PRODUCTS!!!!  As discussed nicotine severely impairs your body's ability to  heal surgical and traumatic wounds but also impairs bone healing.  Wounds and bone heal by forming microscopic blood vessels (angiogenesis) and nicotine is a vasoconstrictor (essentially, shrinks blood vessels).  Therefore, if vasoconstriction occurs to these microscopic blood vessels they essentially disappear and are unable to deliver necessary nutrients to the healing tissue.  This is one modifiable factor that you can do to dramatically increase your chances of healing your injury.    (This means no smoking, no nicotine gum, patches, etc)  DO NOT USE NONSTEROIDAL ANTI-INFLAMMATORY DRUGS (NSAID'S)  Using products such as Advil (ibuprofen), Aleve (naproxen), Motrin (ibuprofen) for additional pain control during fracture healing can delay and/or prevent the healing response.  If you would like to take over the counter (OTC) medication, Tylenol (acetaminophen) is ok.  However, some narcotic medications that are given for pain control contain acetaminophen as well. Therefore, you should not exceed more than 4000 mg of tylenol in a day if you do not have liver disease.  Also note that there are may OTC medicines, such as cold medicines and allergy medicines that my contain tylenol as well.  If you have any questions about medications and/or interactions please ask your doctor/PA or your pharmacist.      ICE AND ELEVATE INJURED/OPERATIVE EXTREMITY  Using ice and elevating the injured extremity above your heart can help with swelling and pain control.  Icing in a pulsatile fashion, such as 20 minutes on and 20 minutes off, can be followed.    Do not place ice directly on skin. Make sure there is a barrier between to skin and the ice pack.  Using frozen items such as frozen peas works well as the conform nicely to the are that needs to be iced.  USE AN ACE WRAP OR TED HOSE FOR SWELLING CONTROL  In addition to icing and elevation, Ace wraps or TED hose are used to help limit and resolve swelling.  It is  recommended to use Ace wraps or TED hose until you are informed to stop.    When using Ace Wraps start the wrapping distally (farthest away from the body) and wrap proximally (closer to the body)   Example: If you had surgery on your leg or thing and you do not have a splint on, start the ace wrap at the toes and work your way up to the thigh        If you had surgery on your upper extremity and do not have a splint on, start the ace wrap at your fingers and work your way up to the upper arm   CALL THE OFFICE WITH ANY QUESTIONS OR CONCERNS: [phone]   VISIT OUR WEBSITE FOR ADDITIONAL INFORMATION: [email or web address]    Discharge Wound Care Instructions  Do NOT apply any ointments, solutions or lotions to pin sites or surgical wounds.  These prevent needed drainage and even though solutions like hydrogen peroxide kill bacteria, they also damage cells lining the pin sites that help fight infection.  Applying lotions or ointments can keep the wounds moist and can cause them to breakdown and open up as well. This can increase the risk for infection. When in doubt call the office.  If any drainage is noted, use one layer of adaptic, then gauze, Kerlix, and an ace wrap.  Once the incision is completely dry and without drainage, it may be left open to air out.  Showering may begin 36-48 hours later.  Cleaning gently with soap and water.

## 2021-06-25 ENCOUNTER — Other Ambulatory Visit (HOSPITAL_COMMUNITY): Payer: Self-pay

## 2021-06-30 ENCOUNTER — Other Ambulatory Visit (HOSPITAL_COMMUNITY): Payer: Self-pay | Admitting: Student

## 2021-06-30 ENCOUNTER — Other Ambulatory Visit (HOSPITAL_COMMUNITY): Payer: Self-pay

## 2021-07-09 ENCOUNTER — Other Ambulatory Visit (HOSPITAL_COMMUNITY): Payer: Self-pay

## 2022-11-05 ENCOUNTER — Ambulatory Visit (HOSPITAL_COMMUNITY)
Admission: EM | Admit: 2022-11-05 | Discharge: 2022-11-05 | Disposition: A | Discharge status: Home / Self Care | Payer: Medicaid Other | Attending: Emergency Medicine | Admitting: Emergency Medicine

## 2022-11-05 ENCOUNTER — Encounter (HOSPITAL_COMMUNITY): Payer: Self-pay | Admitting: *Deleted

## 2022-11-05 DIAGNOSIS — M791 Myalgia, unspecified site: Secondary | ICD-10-CM | POA: Diagnosis not present

## 2022-11-05 DIAGNOSIS — M25512 Pain in left shoulder: Secondary | ICD-10-CM

## 2022-11-05 MED ORDER — KETOROLAC TROMETHAMINE 30 MG/ML IJ SOLN
INTRAMUSCULAR | Status: AC
  Filled 2022-11-05: qty 1

## 2022-11-05 MED ORDER — CYCLOBENZAPRINE HCL 10 MG PO TABS: 10.0000 mg | ORAL_TABLET | Freq: Two times a day (BID) | ORAL | 0 refills | Status: DC | PRN

## 2022-11-05 MED ORDER — KETOROLAC TROMETHAMINE 30 MG/ML IJ SOLN
30.0000 mg | Freq: Once | INTRAMUSCULAR | Status: AC
  Administered 2022-11-05: 30 mg via INTRAMUSCULAR

## 2022-11-05 MED ORDER — IBUPROFEN 800 MG PO TABS: 800.0000 mg | ORAL_TABLET | Freq: Three times a day (TID) | ORAL | 0 refills | Status: DC
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
## 2022-11-05 NOTE — Discharge Instructions (Addendum)
The injection given today should provide several hours of pain relief.  Please do not take any ibuprofen/Advil the rest of today. Tylenol can safely be used today. You can start taking ibuprofen tomorrow, every 6 hours as needed.  You can take the muscle relaxer twice daily. If the medication makes you drowsy, take only at bed time.  You may have some soreness and discomfort for the next several days but it should begin to improve over the week. If you have worsening symptoms, please return
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
## 2022-11-18 ENCOUNTER — Encounter (HOSPITAL_COMMUNITY): Payer: Self-pay | Admitting: *Deleted

## 2022-11-18 ENCOUNTER — Ambulatory Visit (HOSPITAL_COMMUNITY)
Admission: EM | Admit: 2022-11-18 | Discharge: 2022-11-18 | Disposition: A | Discharge status: Home / Self Care | Payer: Medicaid Other

## 2022-11-18 ENCOUNTER — Other Ambulatory Visit: Payer: Self-pay

## 2022-11-18 DIAGNOSIS — R519 Headache, unspecified: Secondary | ICD-10-CM

## 2022-11-18 DIAGNOSIS — I10 Essential (primary) hypertension: Secondary | ICD-10-CM

## 2022-11-18 NOTE — Discharge Instructions (Addendum)
Your blood pressure was stable today in clinic.  You can alternate between Tylenol and ibuprofen for your headache.  I have attached a blood pressure log, please take this within 2 minutes of waking up and within minutes of going to bed until you follow-up with a primary care provider so they can have an adequate trend of your blood pressures.   Please seek immediate care if you develop the worst headache of your life, any vision changes, or any new concerning symptoms.

## 2022-11-18 NOTE — ED Notes (Signed)
Attempt to establish an appt with a PCP; during the process, pt states he wanted to wait until he can look at his schedule, and he would make appt on his own.

## 2022-11-18 NOTE — ED Provider Notes (Signed)
[facility]    CSN: [id number] Arrival date & time: 11/18/22  1250      History   Chief Complaint Chief Complaint  Patient presents with   Hypertension    HPI Matthew Kaiser is a 32 y.o. male.   Patient presents to clinic over concerns of hypertension.  He was at his sister's house yesterday and he checked his blood pressure, it was 200/154.  He had a headache yesterday and has a slight headache today.  He has not taken anything for his headache.  He was on amlodipine about a year ago, he ran out of his medications and never got a refill.  Reports he did have some light sensitivity this morning.  He denies chest pain, shortness of breath or vision changes.  He does wear prescription glasses, has been a few years since he has been to the eye doctor.  Does not currently have a primary care provider.     The history is provided by the patient and medical records.  Hypertension Associated symptoms include headaches. Pertinent negatives include no chest pain, no abdominal pain and no shortness of breath.    Past Medical History:  Diagnosis Date   Hypertension     Patient Active Problem List   Diagnosis Date Noted   Fracture of shaft of left femur (HCC) 06/23/2021    Past Surgical History:  Procedure Laterality Date   FEMUR IM NAIL Left 06/23/2021   Procedure: INTRAMEDULLARY (IM) RETROGRADE FEMORAL NAILING;  Surgeon: Roby Lofts, MD;  Location: [facility] OR;  Service: Orthopedics;  Laterality: Left;       Home Medications    Prior to Admission medications   Medication Sig Start Date End Date Taking? Authorizing Provider  cyclobenzaprine (FLEXERIL) 10 MG tablet Take 1 tablet (10 mg total) by mouth 2 (two) times daily as needed for muscle spasms. 11/05/22   Rising, Lurena Joiner, PA-C  ibuprofen (ADVIL) 800 MG tablet Take 1 tablet (800 mg total) by mouth 3 (three) times daily. 11/05/22   Rising, Ray Church    Family History History reviewed. No pertinent family  history.  Social History Social History   Tobacco Use   Smoking status: Former    Packs/day: .5    Types: Cigarettes   Smokeless tobacco: Current  Vaping Use   Vaping Use: Some days  Substance Use Topics   Alcohol use: Yes    Comment: socially   Drug use: Yes    Types: Marijuana     Allergies   Patient has no known allergies.   Review of Systems Review of Systems  Constitutional:  Negative for chills and fever.  Respiratory:  Negative for cough, shortness of breath and wheezing.   Cardiovascular:  Negative for chest pain.  Gastrointestinal:  Negative for abdominal pain.  Neurological:  Positive for headaches.     Physical Exam Triage Vital Signs ED Triage Vitals  Enc Vitals Group     BP 11/18/22 1259 115/75     Pulse Rate 11/18/22 1259 74     Resp 11/18/22 1259 16     Temp 11/18/22 1259 97.8 F (36.6 C)     Temp Source 11/18/22 1259 Oral     SpO2 11/18/22 1259 96 %     Weight --      Height --      Head Circumference --      Peak Flow --      Pain Score 11/18/22 1300 5     Pain  Loc --      Pain Edu? --      Excl. in GC? --    No data found.  Updated Vital Signs BP 115/75   Pulse 74   Temp 97.8 F (36.6 C) (Oral)   Resp 16   SpO2 96%   Visual Acuity Right Eye Distance:   Left Eye Distance:   Bilateral Distance:    Right Eye Near:   Left Eye Near:    Bilateral Near:     Physical Exam Vitals and nursing note reviewed.  Constitutional:      Appearance: Normal appearance.  HENT:     Head: Normocephalic and atraumatic.     Right Ear: External ear normal.     Left Ear: External ear normal.     Nose: Nose normal.     Mouth/Throat:     Mouth: Mucous membranes are moist.  Eyes:     General: No scleral icterus.       Right eye: No discharge.        Left eye: No discharge.     Extraocular Movements: Extraocular movements intact.     Conjunctiva/sclera: Conjunctivae normal.     Pupils: Pupils are equal, round, and reactive to light.   Cardiovascular:     Rate and Rhythm: Normal rate.  Pulmonary:     Effort: Pulmonary effort is normal. No respiratory distress.  Musculoskeletal:        General: No swelling. Normal range of motion.     Cervical back: Normal range of motion.  Skin:    General: Skin is warm and dry.  Neurological:     General: No focal deficit present.     Mental Status: He is alert and oriented to person, place, and time.  Psychiatric:        Mood and Affect: Mood normal.        Behavior: Behavior normal. Behavior is cooperative.      UC Treatments / Results  Labs (all labs ordered are listed, but only abnormal results are displayed) Labs Reviewed - No data to display  EKG   Radiology No results found.  Procedures Procedures (including critical care time)  Medications Ordered in UC Medications - No data to display  Initial Impression / Assessment and Plan / UC Course  I have reviewed the triage vital signs and the nursing notes.  Pertinent labs & imaging results that were available during my care of the patient were reviewed by me and considered in my medical decision making (see chart for details).  Vitals and triage reviewed, patient is hemodynamically stable.  Without hypertensive urgency or emergency in clinic. GCS 15, PERRLA, EOMI. given blood pressure log form and encouraged to take record to primary care appointment, staff will set up prior to discharge.  Strict emergency return precautions given, patient verbalized understanding, no questions at this time.    Final Clinical Impressions(s) / UC Diagnoses   Final diagnoses:  Essential hypertension  Acute intractable headache, unspecified headache type     Discharge Instructions      Your blood pressure was stable today in clinic.  You can alternate between Tylenol and ibuprofen for your headache.  I have attached a blood pressure log, please take this within 2 minutes of waking up and within minutes of going to bed until  you follow-up with a primary care provider so they can have an adequate trend of your blood pressures.   Please seek immediate care if you develop the  worst headache of your life, any vision changes, or any new concerning symptoms.     ED Prescriptions   None    PDMP not reviewed this encounter.   Azzure Garabedian, [place] N, [place] 11/18/22 1319

## 2022-11-18 NOTE — ED Triage Notes (Signed)
States yesterday at sister's house his BP was 200/154; states had HA yesterday, and has a slight HA today. States was on HTN meds approx 1 yr ago, but ran out and never got a refill.

## 2024-02-22 ENCOUNTER — Ambulatory Visit (HOSPITAL_COMMUNITY)
Admission: EM | Admit: 2024-02-22 | Discharge: 2024-02-22 | Disposition: A | Discharge status: Home / Self Care | Payer: Self-pay | Attending: Family Medicine | Admitting: Family Medicine

## 2024-02-22 ENCOUNTER — Encounter (HOSPITAL_COMMUNITY): Payer: Self-pay | Admitting: Emergency Medicine

## 2024-02-22 DIAGNOSIS — M5442 Lumbago with sciatica, left side: Secondary | ICD-10-CM

## 2024-02-22 MED ORDER — KETOROLAC TROMETHAMINE 10 MG PO TABS: 10.0000 mg | ORAL_TABLET | Freq: Four times a day (QID) | ORAL | 0 refills | Status: DC | PRN

## 2024-02-22 MED ORDER — CYCLOBENZAPRINE HCL 10 MG PO TABS: 10.0000 mg | ORAL_TABLET | Freq: Two times a day (BID) | ORAL | 0 refills | Status: DC | PRN

## 2024-02-22 MED ORDER — KETOROLAC TROMETHAMINE 30 MG/ML IJ SOLN
30.0000 mg | Freq: Once | INTRAMUSCULAR | Status: AC
  Administered 2024-02-22: 30 mg via INTRAMUSCULAR

## 2024-02-22 MED ORDER — KETOROLAC TROMETHAMINE 30 MG/ML IJ SOLN
INTRAMUSCULAR | Status: AC
  Filled 2024-02-22: qty 1

## 2024-02-22 NOTE — ED Triage Notes (Signed)
 Pt reports lower left back pain that will radiate down left leg at times that started yesterday. Denies any falls, injury or lifting. Hx herniated disc.  Tried resting and stretching and last night tired ice and heat. Hasn't taken anything to try to help with pain.

## 2024-02-22 NOTE — Discharge Instructions (Signed)
 You have been given a shot of Toradol  30 mg today.  Ketorolac  10 mg tablets--take 1 tablet every 6 hours as needed for pain.  This is the same medicine that is in the shot we just gave you  Cyclobenzaprine  10 mg--1 tablet 2 times daily as needed for muscle spasm or muscle pain.  This medication can make you dizzy or sleepy
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
## 2024-07-10 ENCOUNTER — Ambulatory Visit (HOSPITAL_COMMUNITY)
Admission: EM | Admit: 2024-07-10 | Discharge: 2024-07-10 | Disposition: A | Discharge status: Home / Self Care | Payer: Self-pay

## 2024-07-10 ENCOUNTER — Encounter (HOSPITAL_COMMUNITY): Payer: Self-pay

## 2024-07-10 ENCOUNTER — Ambulatory Visit (INDEPENDENT_AMBULATORY_CARE_PROVIDER_SITE_OTHER): Payer: Self-pay

## 2024-07-10 DIAGNOSIS — M25552 Pain in left hip: Secondary | ICD-10-CM

## 2024-07-10 DIAGNOSIS — M1250 Traumatic arthropathy, unspecified site: Secondary | ICD-10-CM

## 2024-07-10 MED ORDER — DICLOFENAC SODIUM 75 MG PO TBEC: 75.0000 mg | DELAYED_RELEASE_TABLET | Freq: Two times a day (BID) | ORAL | 0 refills | Status: AC

## 2024-07-10 MED ORDER — CYCLOBENZAPRINE HCL 10 MG PO TABS: 10.0000 mg | ORAL_TABLET | Freq: Three times a day (TID) | ORAL | 0 refills | Status: AC | PRN

## 2024-07-10 NOTE — Discharge Instructions (Signed)
" °  1. 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 - cyclobenzaprine  (FLEXERIL ) 10 MG tablet; Take 1 tablet (10 mg total) by mouth 3 (three) times daily as needed for muscle spasms.  Dispense: 20 tablet; Refill: 0 - diclofenac  (VOLTAREN ) 75 MG EC tablet; Take 1 tablet (75 mg total) by mouth 2 (two) times daily.  Dispense: 30 tablet; Refill: 0  -Continue to monitor symptoms for any change in severity if there is any escalation of current symptoms or development of new symptoms follow-up in ER for further evaluation and management. "

## 2024-07-10 NOTE — ED Triage Notes (Addendum)
 Pt presents for evaluation of (L) hip pain since last Thursday. Pt has a hx of broken (L) femur and had screws placed over 1 year wants to make sure nothing has happened since surgery. Patient describes pain as sharp and shoots down left left. Patient able to bear some weight. Pt reports that he will have numbness & tingling down his leg to his feet. Ibuprofen  for pain, last dose last night.   Pt also reports (L) ear pain since last week. Patient is not sure if pain is from his ear or tooth. Reports headache last night. Denies any dizziness. Not sure if water got in ear.

## 2024-07-10 NOTE — ED Provider Notes (Signed)
 " [facility]  Note:  This document was prepared using Dragon voice recognition software and may include unintentional dictation errors.  MRN: [id number] DOB: Nov 14, 1990  Subjective:   Matthew Kaiser is a 34 y.o. male presenting for left upper leg/hip pain since last Thursday.  Patient reports history of traumatic femoral fracture approximately a year ago which was fixed surgically with rods and screws.  Patient was concerned that potentially the surgical appliance shifting.  Patient has occasional numbness and tingling down the leg to his feet.  Has been taking ibuprofen  for pain with minimal improvement.  Patient reports that pain shoots down his left leg.  Patient denies any recent injury or trauma or any exacerbating circumstance.  Patient also reports he has had some left ear pain since last week was not sure if it was from his ear or tooth.  Patient reports he had a headache last night was concerned he might have some water in his ear.  Denies any fever, cough, nasal congestion, body aches, fever.  Current Medications[1]   Allergies[2]  Past Medical History:  Diagnosis Date   Hypertension      Past Surgical History:  Procedure Laterality Date   FEMUR IM NAIL Left 06/23/2021   Procedure: INTRAMEDULLARY (IM) RETROGRADE FEMORAL NAILING;  Surgeon: Kendal Franky SQUIBB, MD;  Location: [facility];  Service: Orthopedics;  Laterality: Left;    History reviewed. No pertinent family history.  Social History[3]  ROS Refer to HPI for ROS details.  Objective:    Vitals: BP 120/76 (BP Location: Left Arm)   Pulse 74   Temp 98.9 F (37.2 C) (Oral)   Resp 16   SpO2 95%   Physical Exam Vitals and nursing note reviewed.  Constitutional:      General: He is not in acute distress.    Appearance: He is well-developed. He is not ill-appearing or toxic-appearing.  HENT:     Head: Normocephalic.     Nose: Nose normal.     Mouth/Throat:     Mouth: Mucous membranes are moist.   Cardiovascular:     Rate and Rhythm: Normal rate.  Pulmonary:     Effort: Pulmonary effort is normal. No respiratory distress.     Breath sounds: No stridor. No wheezing.  Musculoskeletal:     Left upper leg: Tenderness and bony tenderness present. No swelling, edema or deformity.  Skin:    General: Skin is warm and dry.  Neurological:     General: No focal deficit present.     Mental Status: He is alert and oriented to person, place, and time.  Psychiatric:        Mood and Affect: Mood normal.        Behavior: Behavior normal.     Procedures  No results found for this or any previous visit (from the past 24 hours).  Assessment and Plan :     Discharge Instructions       1. 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 - cyclobenzaprine  (FLEXERIL ) 10 MG tablet; Take 1 tablet (10 mg total) by mouth 3 (three) times daily as needed for muscle spasms.  Dispense: 20 tablet; Refill: 0 - diclofenac  (VOLTAREN ) 75 MG EC tablet; Take 1 tablet (75 mg total) by mouth 2 (two) times daily.  Dispense: 30 tablet; Refill: 0  -  Continue to monitor symptoms for any change in severity if there is any escalation of current symptoms or development of new symptoms follow-up in ER for further evaluation and management.     [name] B [name]    [1] No current facility-administered medications for this encounter.  Current Outpatient Medications:    diclofenac  (VOLTAREN ) 75 MG EC tablet, Take 1 tablet (75 mg total) by mouth 2 (two) times daily., Disp: 30 tablet, Rfl: 0   cyclobenzaprine  (FLEXERIL ) 10 MG tablet, Take 1 tablet (10 mg total) by mouth 3 (three) times daily as needed for muscle spasms., Disp: 20 tablet, Rfl: 0 [2] No Known Allergies [3]  Social History Tobacco Use   Smoking status: Former    Current  packs/day: 0.50    Types: Cigarettes   Smokeless tobacco: Current  Vaping Use   Vaping status: Some Days  Substance Use Topics   Alcohol use: Yes    Comment: socially   Drug use: Yes    Types: Marijuana     [name] B, NP 07/10/24 1636  "
